# Patient Record
Sex: Male | Born: 1960 | Race: White | Hispanic: No | Smoking: Former smoker
Health system: Southern US, Community
[De-identification: ages and names within clinical notes are randomized; demographics above are authoritative.]

## PROBLEM LIST (undated history)

## (undated) DIAGNOSIS — E785 Hyperlipidemia, unspecified: Secondary | ICD-10-CM

## (undated) HISTORY — PX: NO PAST SURGERIES: SHX2092

## (undated) HISTORY — DX: Hyperlipidemia, unspecified: E78.5

---

## 2008-10-23 ENCOUNTER — Ambulatory Visit: Payer: Self-pay | Admitting: Internal Medicine

## 2008-10-23 DIAGNOSIS — R209 Unspecified disturbances of skin sensation: Secondary | ICD-10-CM | POA: Insufficient documentation

## 2008-10-23 LAB — CONVERTED CEMR LAB
ALT: 32 units/L (ref 0–53)
Basophils Absolute: 0.1 10*3/uL (ref 0.0–0.1)
Bilirubin Urine: NEGATIVE
Bilirubin, Direct: 0.1 mg/dL (ref 0.0–0.3)
CO2: 30 meq/L (ref 19–32)
Calcium: 9.3 mg/dL (ref 8.4–10.5)
Cholesterol: 229 mg/dL (ref 0–200)
Eosinophils Absolute: 0.2 10*3/uL (ref 0.0–0.7)
GFR calc Af Amer: 103 mL/min
GFR calc non Af Amer: 86 mL/min
Hemoglobin: 15.7 g/dL (ref 13.0–17.0)
Ketones, ur: NEGATIVE mg/dL
Leukocytes, UA: NEGATIVE
Lymphocytes Relative: 36.7 % (ref 12.0–46.0)
MCHC: 34.7 g/dL (ref 30.0–36.0)
Neutro Abs: 3.3 10*3/uL (ref 1.4–7.7)
Neutrophils Relative %: 50 % (ref 43.0–77.0)
RDW: 12.5 % (ref 11.5–14.6)
Sodium: 141 meq/L (ref 135–145)
TSH: 0.93 microintl units/mL (ref 0.35–5.50)
Total Bilirubin: 0.9 mg/dL (ref 0.3–1.2)
Total CHOL/HDL Ratio: 5
Triglycerides: 99 mg/dL (ref 0–149)
Urobilinogen, UA: 0.2 (ref 0.0–1.0)
VLDL: 20 mg/dL (ref 0–40)
Vitamin B-12: 228 pg/mL (ref 211–911)

## 2009-06-16 ENCOUNTER — Ambulatory Visit: Payer: Self-pay | Admitting: Internal Medicine

## 2009-06-16 LAB — CONVERTED CEMR LAB
Sed Rate: 8 mm/hr (ref 0–22)
TSH: 1.35 microintl units/mL (ref 0.35–5.50)
Vitamin B-12: 275 pg/mL (ref 211–911)

## 2009-07-15 ENCOUNTER — Ambulatory Visit: Payer: Self-pay | Admitting: Internal Medicine

## 2009-07-15 DIAGNOSIS — E538 Deficiency of other specified B group vitamins: Secondary | ICD-10-CM

## 2009-07-15 DIAGNOSIS — G5600 Carpal tunnel syndrome, unspecified upper limb: Secondary | ICD-10-CM | POA: Insufficient documentation

## 2009-08-06 ENCOUNTER — Ambulatory Visit: Payer: Self-pay | Admitting: Internal Medicine

## 2009-08-06 DIAGNOSIS — Z87891 Personal history of nicotine dependence: Secondary | ICD-10-CM | POA: Insufficient documentation

## 2009-08-06 DIAGNOSIS — L538 Other specified erythematous conditions: Secondary | ICD-10-CM

## 2010-07-22 ENCOUNTER — Ambulatory Visit: Payer: Self-pay | Admitting: Internal Medicine

## 2010-07-22 DIAGNOSIS — M25529 Pain in unspecified elbow: Secondary | ICD-10-CM

## 2010-11-17 NOTE — Assessment & Plan Note (Signed)
Summary: arm pain/cd   Vital Signs:  Patient profile:   50 year old male Height:      66 inches Weight:      173 pounds BMI:     28.02 Temp:     97.2 degrees F oral Pulse rate:   53 / minute Pulse rhythm:   regular BP sitting:   110 / 78  (left arm) Cuff size:   regular  Vitals Entered By: Lanier Prude, CMA(AAMA) (July 22, 2010 10:43 AM) CC: Lt elbow pain Is Patient Diabetic? No Comments pt states he is not taking any meds currently   CC:  Lt elbow pain.  History of Present Illness: C/o L elbow pain x 1-2 months  off and on - possible hit it  Preventive Screening-Counseling & Management  Caffeine-Diet-Exercise     Does Patient Exercise: yes  Current Medications (verified): 1)  Triamcinolone Acetonide 0.1 % Oint (Triamcinolone Acetonide) .... Use 1-2 Times A Day 2)  Ibuprofen 600 Mg  Tabs (Ibuprofen) .Marland Kitchen.. 1 By Mouth Two Times A Day As Needed Pain 3)  Vitamin B-12 500 Mcg Tabs (Cyanocobalamin) .Marland Kitchen.. 1 By Mouth Qd 4)  Vitamin D3 1000 Unit  Tabs (Cholecalciferol) .Marland Kitchen.. 1 By Mouth Daily 5)  Pennsaid 1.5 % Soln (Diclofenac Sodium) .... 3 Gtt On Wrist Tid  Allergies (verified): No Known Drug Allergies  Social History: Occupation: Curator  Married Former Smoker Alcohol use-no Regular exercise-yes Does Patient Exercise:  yes  Review of Systems       Lost wt on diet  Physical Exam  General:  Well-developed,well-nourished,in no acute distress; alert,appropriate and cooperative throughout examination Lungs:  CTA Heart:  RRR Msk:  Lelbow  lat epic is tender Skin:  Erythem confluent papules w/scaling in inguinal folds B   Impression & Recommendations:  Problem # 1:  ELBOW PAIN (ICD-719.42) L - poss tennis elbow Assessment New Pennsaid Ibuprofen if not better Will inject if meds fail Orders: T-Elbow Left 2 View (73070TC)  Problem # 2:  Wt loss on diet Assessment: New He is feeling well  Complete Medication List: 1)  Triamcinolone Acetonide 0.1 % Oint  (Triamcinolone acetonide) .... Use 1-2 times a day 2)  Ibuprofen 600 Mg Tabs (Ibuprofen) .Marland Kitchen.. 1 by mouth two times a day as needed pain 3)  Vitamin B-12 500 Mcg Tabs (Cyanocobalamin) .Marland Kitchen.. 1 by mouth qd 4)  Vitamin D3 1000 Unit Tabs (Cholecalciferol) .Marland Kitchen.. 1 by mouth daily 5)  Pennsaid 1.5 % Soln (Diclofenac sodium) .... 3 gtt  three times a day on elbow 6)  Naproxen 500 Mg Tabs (Naproxen) .Marland Kitchen.. 1 by mouth two times a day pc for pain x 2 wks then prn  Patient Instructions: 1)  Call if you are not better in a reasonable amount of time or if worse.  Prescriptions: PENNSAID 1.5 % SOLN (DICLOFENAC SODIUM) 3 gtt  three times a day on elbow  #1 x 1   Entered and Authorized by:   Tresa Garter MD   Signed by:   Tresa Garter MD on 07/22/2010   Method used:   Print then Give to Patient   RxID:   [id number] NAPROXEN 500 MG TABS (NAPROXEN) 1 by mouth two times a day pc for pain x 2 wks then prn  #60 x 1   Entered and Authorized by:   Tresa Garter MD   Signed by:   Tresa Garter MD on 07/22/2010   Method used:   Print then Give to  Patient   RxID:   [id number]

## 2011-11-04 IMAGING — CR DG ELBOW 2V*L*
2 series · 2 of 2 positions shown · non-contrast
Comparison: None.

CLINICAL DATA: Lateral elbow pain

LEFT ELBOW - 2 VIEW

[view not recorded (1 of 2)]
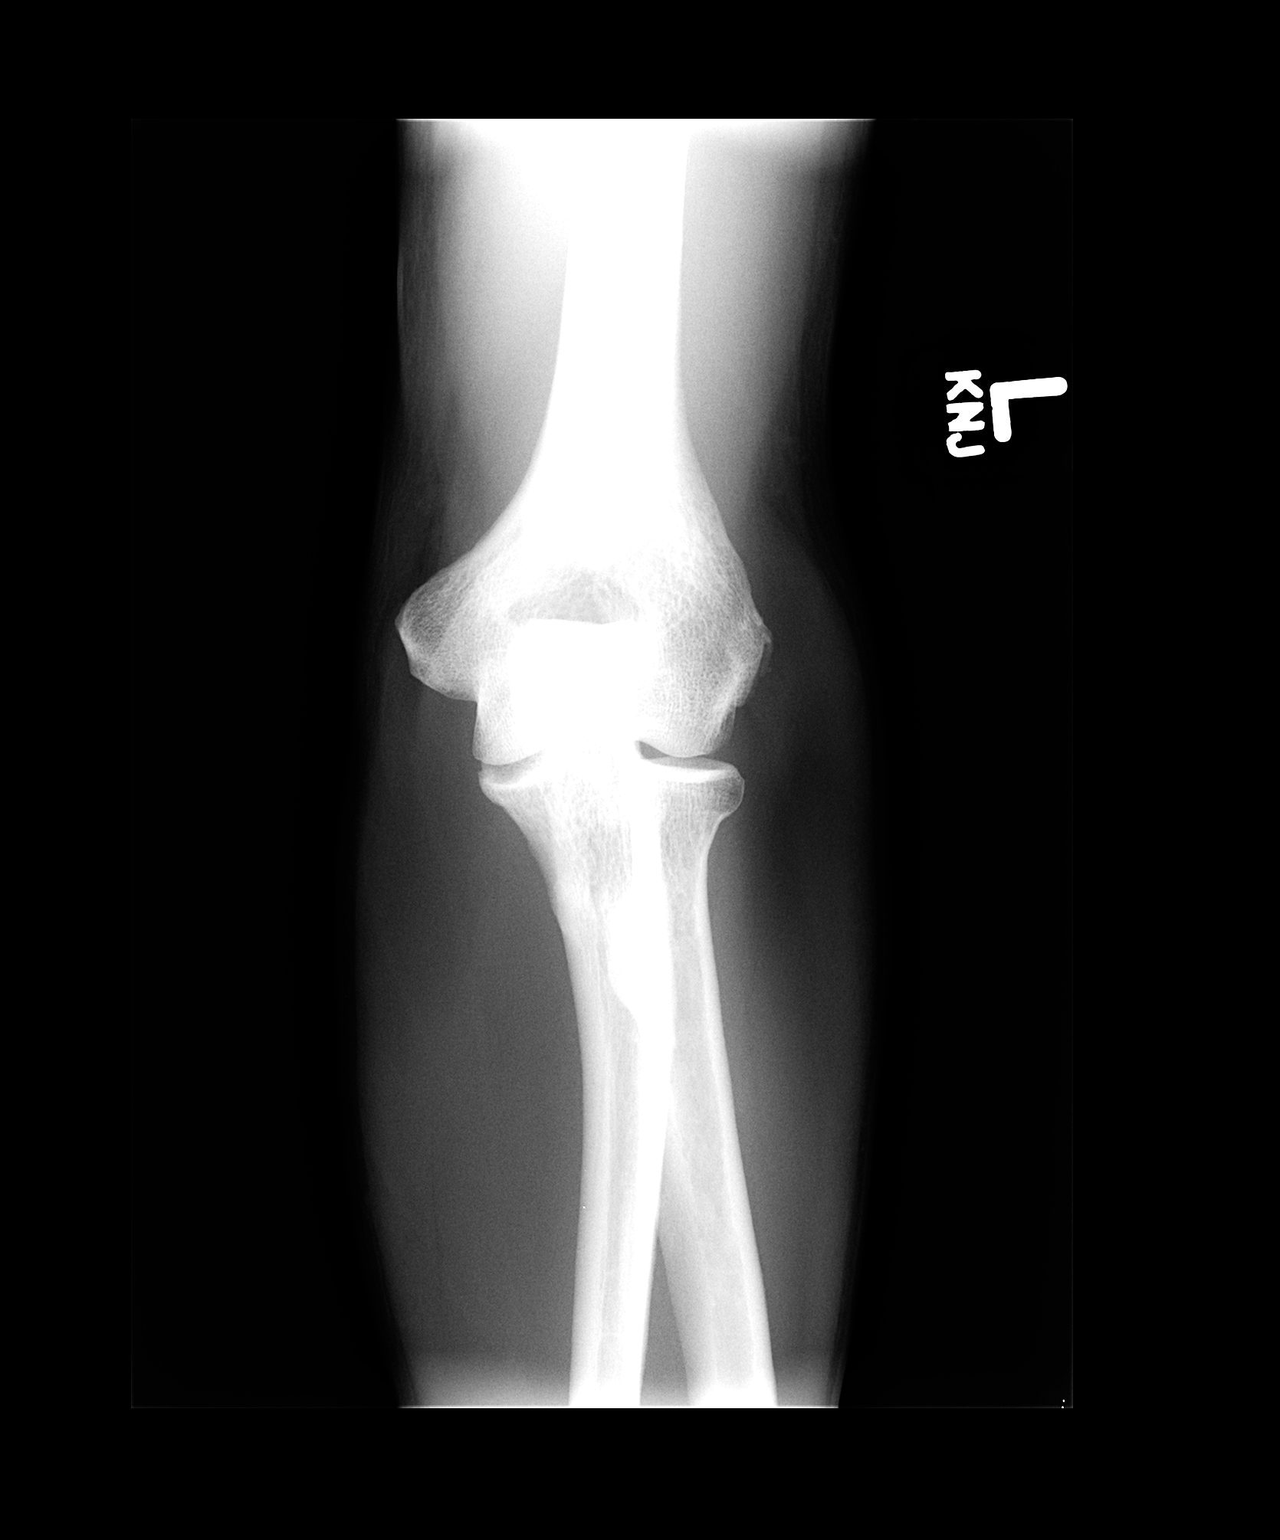

[view not recorded (2 of 2)]
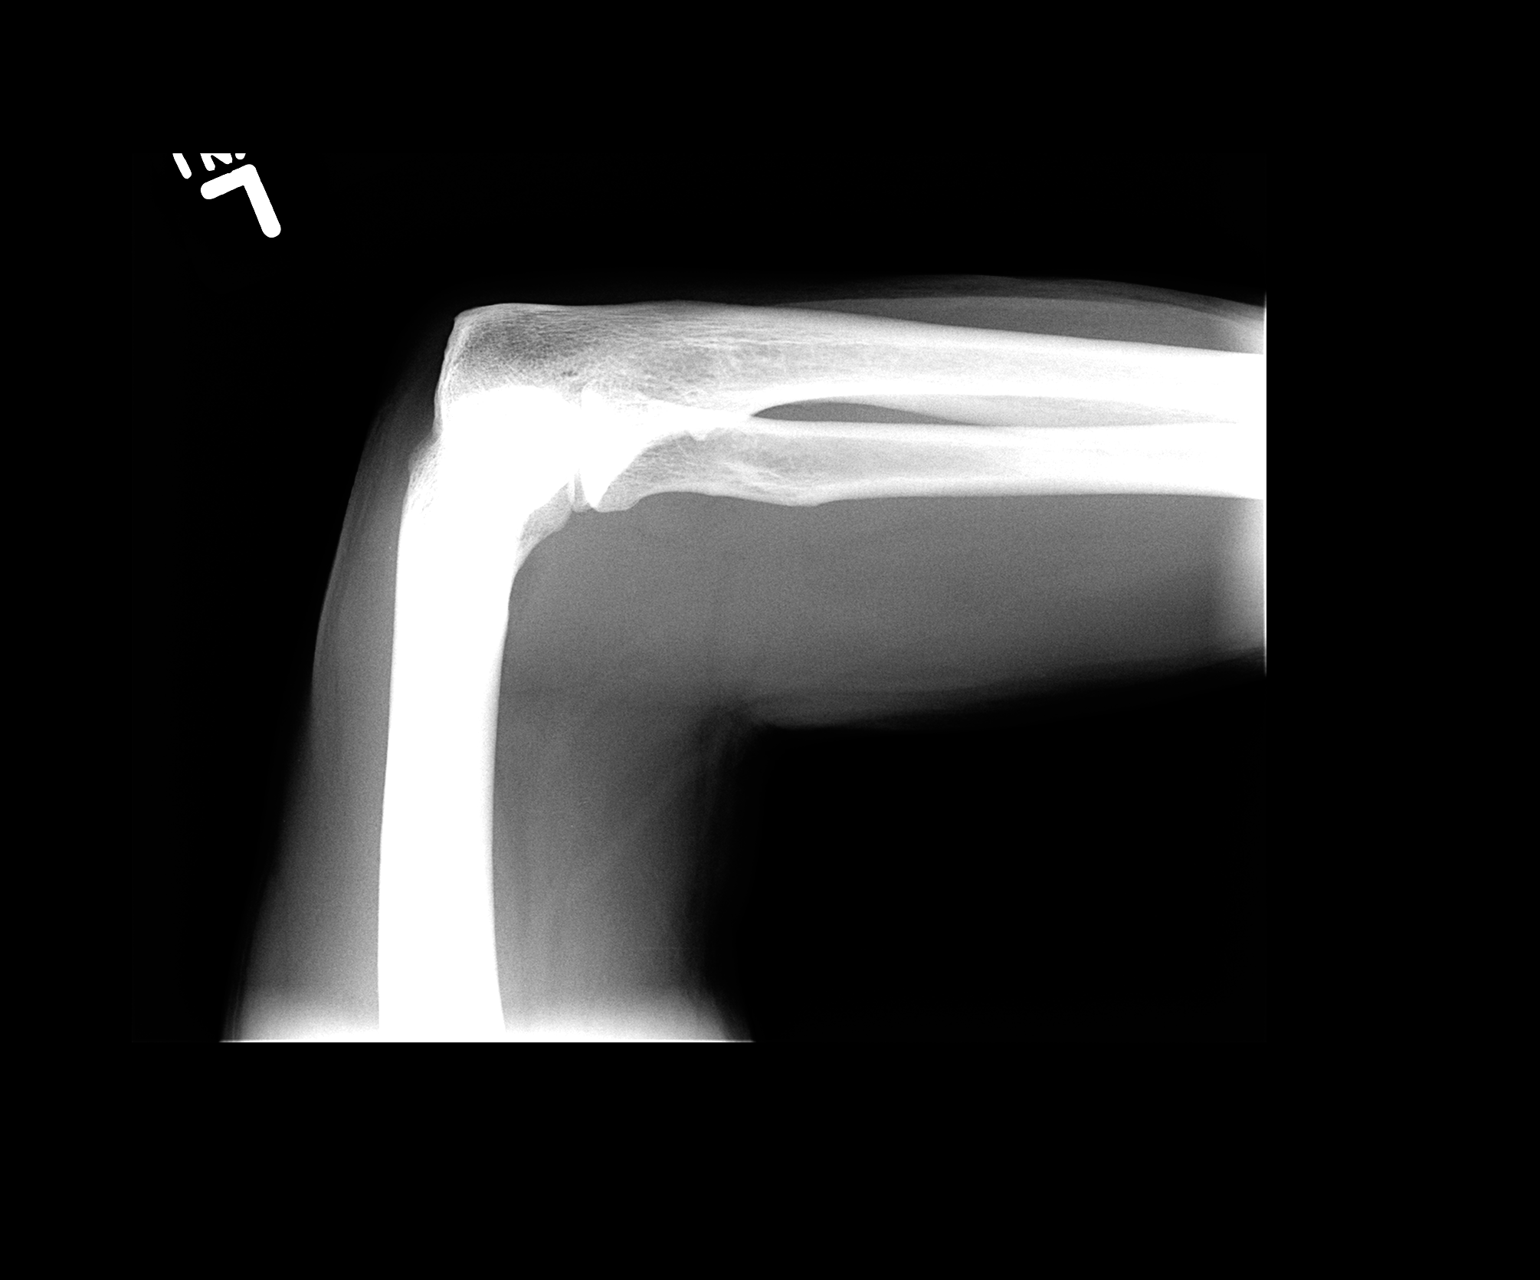

[2 of 2 positions shown; findings below may reference images not displayed]

FINDINGS: There is no evidence of acute fracture dislocation.  A
small osteophytic right is seen at the lateral epicondyle.  No
joint effusion is seen.  The soft tissues are within normal limits.
IMPRESSION: Small osteophyte at the lateral epicondyle.  No acute fracture.

## 2012-03-15 ENCOUNTER — Ambulatory Visit: Payer: Self-pay | Admitting: Family Medicine

## 2012-03-21 ENCOUNTER — Encounter: Payer: Self-pay | Admitting: Internal Medicine

## 2012-03-21 ENCOUNTER — Ambulatory Visit (INDEPENDENT_AMBULATORY_CARE_PROVIDER_SITE_OTHER): Payer: Managed Care, Other (non HMO) | Admitting: Internal Medicine

## 2012-03-21 VITALS — BP 128/80 | HR 60 | Temp 98.1°F | Resp 16 | Ht 69.0 in | Wt 172.0 lb

## 2012-03-21 DIAGNOSIS — Z0000 Encounter for general adult medical examination without abnormal findings: Secondary | ICD-10-CM

## 2012-03-21 LAB — COMPREHENSIVE METABOLIC PANEL
ALT: 22 U/L (ref 0–53)
AST: 30 U/L (ref 0–37)
Alkaline Phosphatase: 43 U/L (ref 39–117)
BUN: 18 mg/dL (ref 6–23)
Creatinine, Ser: 0.9 mg/dL (ref 0.4–1.5)
Total Bilirubin: 0.5 mg/dL (ref 0.3–1.2)

## 2012-03-21 LAB — CBC WITH DIFFERENTIAL/PLATELET
Basophils Absolute: 0.1 10*3/uL (ref 0.0–0.1)
Basophils Relative: 1.4 % (ref 0.0–3.0)
Eosinophils Absolute: 0.3 10*3/uL (ref 0.0–0.7)
HCT: 43.9 % (ref 39.0–52.0)
Hemoglobin: 14.5 g/dL (ref 13.0–17.0)
Lymphs Abs: 1.9 10*3/uL (ref 0.7–4.0)
MCHC: 33 g/dL (ref 30.0–36.0)
Neutro Abs: 3.1 10*3/uL (ref 1.4–7.7)
RBC: 4.99 Mil/uL (ref 4.22–5.81)
RDW: 13.7 % (ref 11.5–14.6)

## 2012-03-21 NOTE — Patient Instructions (Signed)
It is important that you exercise regularly, at least 20 minutes 3 to 4 times per week.  If you develop chest pain or shortness of breath seek  medical attention.  Limit your sodium (Salt) intake  Return in one year for follow-up  

## 2012-03-21 NOTE — Progress Notes (Signed)
Subjective:    Patient ID: Anthony Acosta, male    DOB: 1961-08-21, 51 y.o.   MRN: [id number]  HPI  51 .-year-old patient who is seen today to establish with this practice. He has enjoyed excellent health. He has been followed by another provider is in this practice.  He has a history of borderline hypertension but has been very successful at weight loss and a regular exercise regimen. 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 Approximately 2 or 3 weeks ago he had an episode of brief dizziness while working underneath the chassis of a truck. This has not reoccurred. He has had no symptoms with jogging. He is accompanied by his son who assists  with communication.  Past medical history is fairly unremarkable. He does have a history of B12 deficiency controlled with oral medication. He continues to have symptoms consistent with mild right carpal tunnel syndrome. He is a former smoker but discontinued in March 20, 2007  Family history father died of a CVA at 39. Mother also had cerebral vascular disease and died at 46 also had coronary artery disease and what sounds like a pacemaker insertion One brother health unknown  Social history. Presently nonsmoker married quite active physically. Works as a Curator    Review of Systems  Constitutional: Negative for fever, chills, activity change, appetite change and fatigue.  HENT: Negative for hearing loss, ear pain, congestion, rhinorrhea, sneezing, mouth sores, trouble swallowing, neck pain, neck stiffness, dental problem, voice change, sinus pressure and tinnitus.   Eyes: Negative for photophobia, pain, redness and visual disturbance.  Respiratory: Negative for apnea, cough, choking, chest tightness, shortness of breath and wheezing.   Cardiovascular: Negative for chest pain, palpitations and leg swelling.  Gastrointestinal: Negative for nausea, vomiting, abdominal pain, diarrhea, constipation, blood in  stool, abdominal distention, anal bleeding and rectal pain.  Genitourinary: Negative for dysuria, urgency, frequency, hematuria, flank pain, decreased urine volume, discharge, penile swelling, scrotal swelling, difficulty urinating, genital sores and testicular pain.  Musculoskeletal: Negative for myalgias, back pain, joint swelling, arthralgias and gait problem.  Skin: Negative for color change, rash and wound.  Neurological: Negative for dizziness, tremors, seizures, syncope, facial asymmetry, speech difficulty, weakness, light-headedness, numbness (paresthesias right hand) and headaches.  Hematological: Negative for adenopathy. Does not bruise/bleed easily.  Psychiatric/Behavioral: Negative for suicidal ideas, hallucinations, behavioral problems, confusion, sleep disturbance, self-injury, dysphoric mood, decreased concentration and agitation. The patient is not nervous/anxious.        Objective:   Physical Exam  Constitutional: He appears well-developed and well-nourished.  HENT:  Head: Normocephalic and atraumatic.  Right Ear: External ear normal.  Left Ear: External ear normal.  Nose: Nose normal.  Mouth/Throat: Oropharynx is clear and moist.  Eyes: Conjunctivae and EOM are normal. Pupils are equal, round, and reactive to light. No scleral icterus.  Neck: Normal range of motion. Neck supple. No JVD present. No thyromegaly present.  Cardiovascular: Regular rhythm, normal heart sounds and intact distal pulses.  Exam reveals no gallop and no friction rub.   No murmur heard. Pulmonary/Chest: Effort normal and breath sounds normal. He exhibits no tenderness.  Abdominal: Soft. Bowel sounds are normal. He exhibits no distension and no mass. There is no tenderness.  Musculoskeletal: Normal range of motion. He exhibits no edema and no tenderness.       Negative Tinel's  Lymphadenopathy:    He has no cervical adenopathy.  Neurological: He is alert. He has  normal reflexes. No cranial nerve  deficit. Coordination normal.  Skin: Skin is warm and dry. No rash noted.  Psychiatric: He has a normal mood and affect. His behavior is normal.          Assessment & Plan:   Single episode of near-syncope. We'll clinically observe at the present time. Preventive health examination Will continue active lifestyle including exercise regimen and healthy eating habits.  We'll check some updated lab Continue B12 supplementation  Recheck one year or as needed

## 2012-03-22 ENCOUNTER — Encounter: Payer: Self-pay | Admitting: Internal Medicine

## 2012-03-31 ENCOUNTER — Ambulatory Visit: Payer: Self-pay | Admitting: Internal Medicine

## 2013-07-30 ENCOUNTER — Telehealth: Payer: Self-pay | Admitting: Internal Medicine

## 2013-07-30 NOTE — Telephone Encounter (Signed)
Pt needs to have physical schedule before Friday 08/03/13, he is leaving the country.  Please advise if I schedule him and where?  Thanks a bunch.     Archie Patten

## 2013-07-30 NOTE — Telephone Encounter (Signed)
Put to slots together to make a 30 minute slot somewhere.

## 2013-08-01 ENCOUNTER — Encounter: Payer: Self-pay | Admitting: Internal Medicine

## 2013-08-01 ENCOUNTER — Ambulatory Visit (INDEPENDENT_AMBULATORY_CARE_PROVIDER_SITE_OTHER): Payer: Managed Care, Other (non HMO) | Admitting: Internal Medicine

## 2013-08-01 VITALS — BP 120/70 | HR 60 | Temp 97.8°F | Resp 18 | Ht 68.25 in | Wt 165.0 lb

## 2013-08-01 DIAGNOSIS — Z0000 Encounter for general adult medical examination without abnormal findings: Secondary | ICD-10-CM

## 2013-08-01 LAB — COMPREHENSIVE METABOLIC PANEL
ALT: 26 U/L (ref 0–53)
Albumin: 4.1 g/dL (ref 3.5–5.2)
Alkaline Phosphatase: 38 U/L — ABNORMAL LOW (ref 39–117)
CO2: 27 mEq/L (ref 19–32)
Glucose, Bld: 85 mg/dL (ref 70–99)
Potassium: 5 mEq/L (ref 3.5–5.1)
Sodium: 141 mEq/L (ref 135–145)
Total Bilirubin: 0.7 mg/dL (ref 0.3–1.2)
Total Protein: 6.9 g/dL (ref 6.0–8.3)

## 2013-08-01 LAB — LIPID PANEL
HDL: 77.2 mg/dL (ref >39.00)
Total CHOL/HDL Ratio: 3
Triglycerides: 33 mg/dL (ref 0.0–149.0)

## 2013-08-01 LAB — LDL CHOLESTEROL, DIRECT: Direct LDL: 126.1 mg/dL

## 2013-08-01 LAB — CBC WITH DIFFERENTIAL/PLATELET
Basophils Absolute: 0.1 10*3/uL (ref 0.0–0.1)
Eosinophils Absolute: 0.3 10*3/uL (ref 0.0–0.7)
HCT: 42.3 % (ref 39.0–52.0)
Lymphs Abs: 1.7 10*3/uL (ref 0.7–4.0)
MCHC: 33.5 g/dL (ref 30.0–36.0)
MCV: 86.5 fl (ref 78.0–100.0)
Monocytes Absolute: 0.4 10*3/uL (ref 0.1–1.0)
Neutrophils Relative %: 55.4 % (ref 43.0–77.0)
Platelets: 260 10*3/uL (ref 150.0–400.0)
RDW: 13.4 % (ref 11.5–14.6)
WBC: 5.5 10*3/uL (ref 4.5–10.5)

## 2013-08-01 LAB — PSA: PSA: 0.59 ng/mL (ref 0.10–4.00)

## 2013-08-01 NOTE — Progress Notes (Signed)
Patient ID: Anthony Acosta, male   DOB: 1961/05/12, 52 y.o.   MRN: [id number]  Subjective:    Patient ID: Anthony Acosta, male    DOB: [birth year], 52 y.o.   MRN: [id number]  HPI  52 -year-old patient who is seen today for an annual exam.   He has a history of borderline hypertension but has been very successful at weight loss and a regular exercise regimen. 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   Past medical history is fairly unremarkable. He does have a history of B12 deficiency controlled with oral medication. He has a history of symptoms consistent with mild right carpal tunnel syndrome. He is a former smoker but discontinued in 03-19-07  Family history father died of a CVA at 43. Mother also had cerebral vascular disease and died at 39 also had coronary artery disease and what sounds like a pacemaker insertion One brother health unknown  Social history. Presently nonsmoker married quite active physically. Works as a Curator    Review of Systems  Constitutional: Negative for fever, chills, activity change, appetite change and fatigue.  HENT: Negative for congestion, dental problem, ear pain, hearing loss, mouth sores, rhinorrhea, sinus pressure, sneezing, tinnitus, trouble swallowing and voice change.   Eyes: Negative for photophobia, pain, redness and visual disturbance.  Respiratory: Negative for apnea, cough, choking, chest tightness, shortness of breath and wheezing.   Cardiovascular: Negative for chest pain, palpitations and leg swelling.  Gastrointestinal: Negative for nausea, vomiting, abdominal pain, diarrhea, constipation, blood in stool, abdominal distention, anal bleeding and rectal pain.  Genitourinary: Negative for dysuria, urgency, frequency, hematuria, flank pain, decreased urine volume, discharge, penile swelling, scrotal swelling, difficulty urinating, genital sores and testicular pain.  Musculoskeletal: Negative for  arthralgias, back pain, gait problem, joint swelling, myalgias, neck pain and neck stiffness.  Skin: Negative for color change, rash and wound.  Neurological: Negative for dizziness, tremors, seizures, syncope, facial asymmetry, speech difficulty, weakness, light-headedness, numbness (paresthesias right hand) and headaches.  Hematological: Negative for adenopathy. Does not bruise/bleed easily.  Psychiatric/Behavioral: Negative for suicidal ideas, hallucinations, behavioral problems, confusion, sleep disturbance, self-injury, dysphoric mood, decreased concentration and agitation. The patient is not nervous/anxious.        Objective:   Physical Exam  Constitutional: He appears well-developed and well-nourished.  HENT:  Head: Normocephalic and atraumatic.  Right Ear: External ear normal.  Left Ear: External ear normal.  Nose: Nose normal.  Mouth/Throat: Oropharynx is clear and moist.  Eyes: Conjunctivae and EOM are normal. Pupils are equal, round, and reactive to light. No scleral icterus.  Neck: Normal range of motion. Neck supple. No JVD present. No thyromegaly present.  Cardiovascular: Regular rhythm, normal heart sounds and intact distal pulses.  Exam reveals no gallop and no friction rub.   No murmur heard. Pulmonary/Chest: Effort normal and breath sounds normal. He exhibits no tenderness.  Abdominal: Soft. Bowel sounds are normal. He exhibits no distension and no mass. There is no tenderness.  Genitourinary: Prostate normal and penis normal. Guaiac negative stool.  Uncircumcised  Musculoskeletal: Normal range of motion. He exhibits no edema and no tenderness.  Negative Tinel's  Lymphadenopathy:    He has no cervical adenopathy.  Neurological: He is alert. He has normal reflexes. No cranial nerve deficit. Coordination normal.  Skin: Skin is warm and dry. No rash noted.  Psychiatric: He has a normal mood and affect. His behavior is normal.  Assessment & Plan:     Preventive health examination Will continue active lifestyle including exercise regimen and healthy eating habits.  We'll check some updated lab Continue B12 supplementation  Recheck one year or as needed Screening colonoscopy

## 2013-08-01 NOTE — Patient Instructions (Signed)
It is important that you exercise regularly, at least 20 minutes 3 to 4 times per week.  If you develop chest pain or shortness of breath seek  medical attention.  Return in one year for follow-up   Schedule your colonoscopy to help detect colon cancer. 

## 2014-08-05 ENCOUNTER — Encounter: Payer: Self-pay | Admitting: Internal Medicine

## 2014-08-23 ENCOUNTER — Other Ambulatory Visit (INDEPENDENT_AMBULATORY_CARE_PROVIDER_SITE_OTHER): Payer: BC Managed Care – PPO

## 2014-08-23 DIAGNOSIS — Z0000 Encounter for general adult medical examination without abnormal findings: Secondary | ICD-10-CM

## 2014-08-23 LAB — POCT URINALYSIS DIPSTICK
Bilirubin, UA: NEGATIVE
Blood, UA: NEGATIVE
GLUCOSE UA: NEGATIVE
LEUKOCYTES UA: NEGATIVE
Nitrite, UA: NEGATIVE
SPEC GRAV UA: 1.02
UROBILINOGEN UA: 0.2
pH, UA: 5

## 2014-08-23 LAB — CBC WITH DIFFERENTIAL/PLATELET
BASOS ABS: 0 10*3/uL (ref 0.0–0.1)
Basophils Relative: 0.8 % (ref 0.0–3.0)
Eosinophils Absolute: 0.3 10*3/uL (ref 0.0–0.7)
Eosinophils Relative: 5.1 % — ABNORMAL HIGH (ref 0.0–5.0)
HCT: 44.8 % (ref 39.0–52.0)
Hemoglobin: 14.7 g/dL (ref 13.0–17.0)
LYMPHS PCT: 35.6 % (ref 12.0–46.0)
Lymphs Abs: 2.1 10*3/uL (ref 0.7–4.0)
MCHC: 32.9 g/dL (ref 30.0–36.0)
MCV: 87.7 fl (ref 78.0–100.0)
Monocytes Absolute: 0.5 10*3/uL (ref 0.1–1.0)
Monocytes Relative: 9.3 % (ref 3.0–12.0)
Neutro Abs: 2.9 10*3/uL (ref 1.4–7.7)
Neutrophils Relative %: 49.2 % (ref 43.0–77.0)
PLATELETS: 273 10*3/uL (ref 150.0–400.0)
RBC: 5.1 Mil/uL (ref 4.22–5.81)
RDW: 13.7 % (ref 11.5–15.5)
WBC: 5.8 10*3/uL (ref 4.0–10.5)

## 2014-08-23 LAB — LIPID PANEL
CHOLESTEROL: 262 mg/dL — AB (ref 0–200)
HDL: 80.9 mg/dL (ref >39.00)
LDL CALC: 175 mg/dL — AB (ref 0–99)
NonHDL: 181.1
TRIGLYCERIDES: 32 mg/dL (ref 0.0–149.0)
Total CHOL/HDL Ratio: 3
VLDL: 6.4 mg/dL (ref 0.0–40.0)

## 2014-08-23 LAB — HEPATIC FUNCTION PANEL
ALK PHOS: 38 U/L — AB (ref 39–117)
ALT: 29 U/L (ref 0–53)
AST: 41 U/L — ABNORMAL HIGH (ref 0–37)
Albumin: 3.8 g/dL (ref 3.5–5.2)
BILIRUBIN TOTAL: 1.1 mg/dL (ref 0.2–1.2)
Bilirubin, Direct: 0.1 mg/dL (ref 0.0–0.3)
Total Protein: 7.6 g/dL (ref 6.0–8.3)

## 2014-08-23 LAB — BASIC METABOLIC PANEL
BUN: 14 mg/dL (ref 6–23)
CALCIUM: 9.5 mg/dL (ref 8.4–10.5)
CO2: 29 mEq/L (ref 19–32)
CREATININE: 1 mg/dL (ref 0.4–1.5)
Chloride: 104 mEq/L (ref 96–112)
GFR: 83.27 mL/min (ref >60.00)
GLUCOSE: 83 mg/dL (ref 70–99)
Potassium: 4.4 mEq/L (ref 3.5–5.1)
SODIUM: 138 meq/L (ref 135–145)

## 2014-08-23 LAB — TSH: TSH: 1.1 u[IU]/mL (ref 0.35–4.50)

## 2014-08-23 LAB — PSA: PSA: 0.49 ng/mL (ref 0.10–4.00)

## 2014-08-30 ENCOUNTER — Encounter: Payer: BC Managed Care – PPO | Admitting: Internal Medicine

## 2014-09-04 ENCOUNTER — Encounter: Payer: Self-pay | Admitting: Internal Medicine

## 2014-09-04 ENCOUNTER — Ambulatory Visit (INDEPENDENT_AMBULATORY_CARE_PROVIDER_SITE_OTHER): Payer: BC Managed Care – PPO | Admitting: Internal Medicine

## 2014-09-04 VITALS — BP 120/76 | HR 61 | Temp 98.5°F | Resp 20 | Ht 68.5 in | Wt 165.0 lb

## 2014-09-04 DIAGNOSIS — Z0000 Encounter for general adult medical examination without abnormal findings: Secondary | ICD-10-CM

## 2014-09-04 NOTE — Progress Notes (Signed)
Pre visit review using our clinic review tool, if applicable. No additional management support is needed unless otherwise documented below in the visit note. 

## 2014-09-04 NOTE — Progress Notes (Signed)
Subjective:    Patient ID: Anthony Acosta Mcginn, male    DOB: 02-16-1961, 53 y.o.   MRN: [id number]  HPI  Patient ID: Anthony Acosta Delmar, male   DOB: 02-16-1961, 53 y.o.   MRN: [id number]  Subjective:    Patient ID: Anthony Acosta Kirchhoff, male    DOB: 02-16-1961, 53 y.o.   MRN: [id number]  HPI  [age].-year-old patient who is seen today for an annual exam.    He has a history of borderline hypertension but has been very successful at weight loss and a regular exercise regimen. 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   Past medical history is fairly unremarkable. He does have a history of B12 deficiency controlled with oral medication.  He is a former smoker but discontinued in 2008  Family history father died of a CVA at 7667. Mother also had cerebral vascular disease and died at 6576 also had coronary artery disease and what sounds like a pacemaker insertion One brother health unknown  Social history. Presently nonsmoker married quite active physically. Works as a Curatormechanic    Review of Systems  Constitutional: Negative for fever, chills, activity change, appetite change and fatigue.  HENT: Negative for congestion, dental problem, ear pain, hearing loss, mouth sores, rhinorrhea, sinus pressure, sneezing, tinnitus, trouble swallowing and voice change.   Eyes: Negative for photophobia, pain, redness and visual disturbance.  Respiratory: Negative for apnea, cough, choking, chest tightness, shortness of breath and wheezing.   Cardiovascular: Negative for chest pain, palpitations and leg swelling.  Gastrointestinal: Negative for nausea, vomiting, abdominal pain, diarrhea, constipation, blood in stool, abdominal distention, anal bleeding and rectal pain.  Genitourinary: Negative for dysuria, urgency, frequency, hematuria, flank pain, decreased urine volume, discharge, penile swelling, scrotal swelling, difficulty urinating, genital sores and testicular pain.    Musculoskeletal: Negative for arthralgias, back pain, gait problem, joint swelling, myalgias, neck pain and neck stiffness.  Skin: Negative for color change, rash and wound.  Neurological: Negative for dizziness, tremors, seizures, syncope, facial asymmetry, speech difficulty, weakness, light-headedness, numbness (paresthesias right hand) and headaches.  Hematological: Negative for adenopathy. Does not bruise/bleed easily.  Psychiatric/Behavioral: Negative for suicidal ideas, hallucinations, behavioral problems, confusion, sleep disturbance, self-injury, dysphoric mood, decreased concentration and agitation. The patient is not nervous/anxious.        Objective:   Physical Exam  Constitutional: He appears well-developed and well-nourished.  HENT:  Head: Normocephalic and atraumatic.  Right Ear: External ear normal.  Left Ear: External ear normal.  Nose: Nose normal.  Mouth/Throat: Oropharynx is clear and moist.  Eyes: Conjunctivae and EOM are normal. Pupils are equal, round, and reactive to light. No scleral icterus.  Neck: Normal range of motion. Neck supple. No JVD present. No thyromegaly present.  Cardiovascular: Regular rhythm, normal heart sounds and intact distal pulses.  Exam reveals no gallop and no friction rub.   No murmur heard. Pulmonary/Chest: Effort normal and breath sounds normal. He exhibits no tenderness.  Abdominal: Soft. Bowel sounds are normal. He exhibits no distension and no mass. There is no tenderness.  Genitourinary: Prostate normal and penis normal. Guaiac negative stool.  Uncircumcised  Musculoskeletal: Normal range of motion. He exhibits no edema and no tenderness.  Negative Tinel's  Lymphadenopathy:    He has no cervical adenopathy.  Neurological: He is alert. He has normal reflexes. No cranial nerve deficit. Coordination normal.  Skin: Skin is warm and dry. No rash noted.  Psychiatric: He  has a normal mood and affect. His behavior is normal.           Assessment & Plan:    Preventive health examination Will continue active lifestyle including exercise regimen and healthy eating habits.  We'll check some updated lab Continue B12 supplementation  Recheck one year or as needed Screening colonoscopy again encouraged  Review of Systems As above    Objective:   Physical Exam  As above      Assessment & Plan:  As  sbove

## 2014-09-04 NOTE — Patient Instructions (Addendum)
Limit your sodium (Salt) intake    It is important that you exercise regularly, at least 20 minutes 3 to 4 times per week.  If you develop chest pain or shortness of breath seek  medical attention.  Schedule your colonoscopy to help detect colon cancer.  Health Maintenance A healthy lifestyle and preventative care can promote health and wellness.  Maintain regular health, dental, and eye exams.  Eat a healthy diet. Foods like vegetables, fruits, whole grains, low-fat dairy products, and lean protein foods contain the nutrients you need and are low in calories. Decrease your intake of foods high in solid fats, added sugars, and salt. Get information about a proper diet from your health care provider, if necessary.  Regular physical exercise is one of the most important things you can do for your health. Most adults should get at least 150 minutes of moderate-intensity exercise (any activity that increases your heart rate and causes you to sweat) each week. In addition, most adults need muscle-strengthening exercises on 2 or more days a week.   Maintain a healthy weight. The body mass index (BMI) is a screening tool to identify possible weight problems. It provides an estimate of body fat based on height and weight. Your health care provider can find your BMI and can help you achieve or maintain a healthy weight. For males 20 years and older:  A BMI below 18.5 is considered underweight.  A BMI of 18.5 to 24.9 is normal.  A BMI of 25 to 29.9 is considered overweight.  A BMI of 30 and above is considered obese.  Maintain normal blood lipids and cholesterol by exercising and minimizing your intake of saturated fat. Eat a balanced diet with plenty of fruits and vegetables. Blood tests for lipids and cholesterol should begin at age 52 and be repeated every 5 years. If your lipid or cholesterol levels are high, you are over age 52, or you are at high risk for heart disease, you may need your  cholesterol levels checked more frequently.Ongoing high lipid and cholesterol levels should be treated with medicines if diet and exercise are not working.  If you smoke, find out from your health care provider how to quit. If you do not use tobacco, do not start.  Lung cancer screening is recommended for adults aged 55-80 years who are at high risk for developing lung cancer because of a history of smoking. A yearly low-dose CT scan of the lungs is recommended for people who have at least a 30-pack-year history of smoking and are current smokers or have quit within the past 15 years. A pack year of smoking is smoking an average of 1 pack of cigarettes a day for 1 year (for example, a 30-pack-year history of smoking could mean smoking 1 pack a day for 30 years or 2 packs a day for 15 years). Yearly screening should continue until the smoker has stopped smoking for at least 15 years. Yearly screening should be stopped for people who develop a health problem that would prevent them from having lung cancer treatment.  If you choose to drink alcohol, do not have more than 2 drinks per day. One drink is considered to be 12 oz (360 mL) of beer, 5 oz (150 mL) of wine, or 1.5 oz (45 mL) of liquor.  Avoid the use of street drugs. Do not share needles with anyone. Ask for help if you need support or instructions about stopping the use of drugs.  High blood pressure  causes heart disease and increases the risk of stroke. Blood pressure should be checked at least every 1-2 years. Ongoing high blood pressure should be treated with medicines if weight loss and exercise are not effective.  If you are 65-42 years old, ask your health care provider if you should take aspirin to prevent heart disease.  Diabetes screening involves taking a blood sample to check your fasting blood sugar level. This should be done once every 3 years after age 20 if you are at a normal weight and without risk factors for diabetes. Testing  should be considered at a younger age or be carried out more frequently if you are overweight and have at least 1 risk factor for diabetes.  Colorectal cancer can be detected and often prevented. Most routine colorectal cancer screening begins at the age of 3 and continues through age 96. However, your health care provider may recommend screening at an earlier age if you have risk factors for colon cancer. On a yearly basis, your health care provider may provide home test kits to check for hidden blood in the stool. A small camera at the end of a tube may be used to directly examine the colon (sigmoidoscopy or colonoscopy) to detect the earliest forms of colorectal cancer. Talk to your health care provider about this at age 59 when routine screening begins. A direct exam of the colon should be repeated every 5-10 years through age 104, unless early forms of precancerous polyps or small growths are found.  People who are at an increased risk for hepatitis B should be screened for this virus. You are considered at high risk for hepatitis B if:  You were born in a country where hepatitis B occurs often. Talk with your health care provider about which countries are considered high risk.  Your parents were born in a high-risk country and you have not received a shot to protect against hepatitis B (hepatitis B vaccine).  You have HIV or AIDS.  You use needles to inject street drugs.  You live with, or have sex with, someone who has hepatitis B.  You are a man who has sex with other men (MSM).  You get hemodialysis treatment.  You take certain medicines for conditions like cancer, organ transplantation, and autoimmune conditions.  Hepatitis C blood testing is recommended for all people born from [birth year] through 1964 and any individual with known risk factors for hepatitis C.  Healthy men should no longer receive prostate-specific antigen (PSA) blood tests as part of routine cancer screening. Talk to  your health care provider about prostate cancer screening.  Testicular cancer screening is not recommended for adolescents or adult males who have no symptoms. Screening includes self-exam, a health care provider exam, and other screening tests. Consult with your health care provider about any symptoms you have or any concerns you have about testicular cancer.  Practice safe sex. Use condoms and avoid high-risk sexual practices to reduce the spread of sexually transmitted infections (STIs).  You should be screened for STIs, including gonorrhea and chlamydia if:  You are sexually active and are younger than 24 years.  You are older than 24 years, and your health care provider tells you that you are at risk for this type of infection.  Your sexual activity has changed since you were last screened, and you are at an increased risk for chlamydia or gonorrhea. Ask your health care provider if you are at risk.  If you are at risk of  being infected with HIV, it is recommended that you take a prescription medicine daily to prevent HIV infection. This is called pre-exposure prophylaxis (PrEP). You are considered at risk if:  You are a man who has sex with other men (MSM).  You are a heterosexual man who is sexually active with multiple partners.  You take drugs by injection.  You are sexually active with a partner who has HIV.  Talk with your health care provider about whether you are at high risk of being infected with HIV. If you choose to begin PrEP, you should first be tested for HIV. You should then be tested every 3 months for as long as you are taking PrEP.  Use sunscreen. Apply sunscreen liberally and repeatedly throughout the day. You should seek shade when your shadow is shorter than you. Protect yourself by wearing long sleeves, pants, a wide-brimmed hat, and sunglasses year round whenever you are outdoors.  Tell your health care provider of new moles or changes in moles, especially if  there is a change in shape or color. Also, tell your health care provider if a mole is larger than the size of a pencil eraser.  A one-time screening for abdominal aortic aneurysm (AAA) and surgical repair of large AAAs by ultrasound is recommended for men aged 60-75 years who are current or former smokers.  Stay current with your vaccines (immunizations). Document Released: 04/01/2008 Document Revised: 10/09/2013 Document Reviewed: 03/01/2011 [facility] Patient Information 2015 [place], [place]. This information is not intended to replace advice given to you by your health care provider. Make sure you discuss any questions you have with your health care provider.

## 2015-04-23 ENCOUNTER — Ambulatory Visit: Payer: Self-pay

## 2015-04-23 ENCOUNTER — Ambulatory Visit (INDEPENDENT_AMBULATORY_CARE_PROVIDER_SITE_OTHER): Payer: BLUE CROSS/BLUE SHIELD | Admitting: Family Medicine

## 2015-04-23 VITALS — BP 126/80 | HR 69 | Temp 98.3°F | Resp 17 | Ht 68.5 in | Wt 163.0 lb

## 2015-04-23 DIAGNOSIS — L0291 Cutaneous abscess, unspecified: Secondary | ICD-10-CM

## 2015-04-23 DIAGNOSIS — L089 Local infection of the skin and subcutaneous tissue, unspecified: Secondary | ICD-10-CM

## 2015-04-23 DIAGNOSIS — L0390 Cellulitis, unspecified: Secondary | ICD-10-CM | POA: Diagnosis not present

## 2015-04-23 DIAGNOSIS — Z23 Encounter for immunization: Secondary | ICD-10-CM | POA: Diagnosis not present

## 2015-04-23 MED ORDER — CEPHALEXIN 500 MG PO CAPS: 500.0000 mg | ORAL_CAPSULE | Freq: Four times a day (QID) | ORAL | Status: DC

## 2015-04-23 MED ORDER — CEFTRIAXONE SODIUM 1 G IJ SOLR
1.0000 g | Freq: Once | INTRAMUSCULAR | Status: AC
  Administered 2015-04-23: 1 g via INTRAMUSCULAR

## 2015-04-23 NOTE — Progress Notes (Signed)
Procedure: Verbal consent obtained. Skin was cleaned with alcohol swab and anesthetized with 1 cc 1% lido without epi. A 0.75 cm incision was made at base of left middle finger. A moderate amount of purulent material expressed. Small wound cavity size. Wound packed with small amount 1/4 inch packing. Wound dressed and wound care discussed.

## 2015-04-23 NOTE — Patient Instructions (Signed)
   Change dressing if dressing is soiled. Keep covered at all times except when showering. You may get wet in the shower but do not scrub.  Apply warm compresses/heating pad to facilitate drainage. Leave packing in place. Do not apply any ointments as this may delay healing. If an antibiotic was prescribed, take as directed until finished. Return in 48 hours for wound care.      

## 2015-04-23 NOTE — Progress Notes (Signed)
Chief Complaint:  Chief Complaint  Patient presents with  . Finger Injury    HPI: Anthony Acosta is a 54 y.o. male who is here for  a partial or area with erythema of his left middle finger which started yesterday. He had a little nodule there that have been present for a long time and he started poking and because it started hurting. Then it became worse. Now he has redness and warmth that is spreading from that area to the rest of his finger and up his hand. 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 He has had no fevers or chills. He has not tried anything for this except to clean it out with soap and water. He is right-hand dominant. This is on his left hand. He thinks he might be up-to-date on his tetanus is but not sure  No past medical history on file. No past surgical history on file. History   Social History  . Marital Status: Married    Spouse Name: N/A  . Number of Children: N/A  . Years of Education: N/A   Social History Main Topics  . Smoking status: Former Games developermoker  . Smokeless tobacco: Never Used  . Alcohol Use: No  . Drug Use: No  . Sexual Activity: Not on file   Other Topics Concern  . None   Social History Narrative   Family History  Problem Relation Age of Onset  . Diabetes Mother    No Known Allergies Prior to Admission medications   Not on File     ROS: The patient denies fevers, chills, night sweats, unintentional weight loss, chest pain, palpitations, wheezing, dyspnea on exertion, nausea, vomiting, abdominal pain, dysuria, hematuria, melena, numbness, weakness, or tingling.   All other systems have been reviewed and were otherwise negative with the exception of those mentioned in the HPI and as above.    PHYSICAL EXAM: Filed Vitals:   04/23/15 0912  BP: 126/80  Pulse: 69  Temp: 98.3 F (36.8 C)  Resp: 17   Filed Vitals:   04/23/15 0912  Height: 5' 8.5" (1.74 m)  Weight: 163 lb (73.936 kg)   Body mass index  is 24.42 kg/(m^2).   General: Alert, no acute distress HEENT:  Normocephalic, atraumatic, oropharynx patent. EOMI, PERRLA Cardiovascular:  Regular rate and rhythm, no rubs murmurs or gallops.  No Carotid bruits, radial pulse intact. No pedal edema.  Respiratory: Clear to auscultation bilaterally.  No wheezes, rales, or rhonchi.  No cyanosis, no use of accessory musculature GI: No organomegaly, abdomen is soft and non-tender, positive bowel sounds.  No masses. Skin: Positive left middle finger cellulitis with small abscess, neurovascularly intact in the hand. Good radial pulses. Full range of motion 5 out of 5 strength sensation intact. Neurologic: Facial musculature symmetric. Psychiatric: Patient is appropriate throughout our interaction. Lymphatic: No cervical lymphadenopathy Musculoskeletal: Gait intact.   LABS: Results for orders placed or performed in visit on 08/23/14  Basic metabolic panel  Result Value Ref Range   Sodium 138 135 - 145 mEq/L   Potassium 4.4 3.5 - 5.1 mEq/L   Chloride 104 96 - 112 mEq/L   CO2 29 19 - 32 mEq/L   Glucose, Bld 83 70 - 99 mg/dL   BUN 14 6 - 23 mg/dL   Creatinine, Ser 1.0 0.4 - 1.5 mg/dL   Calcium 9.5 8.4 - 62.910.5 mg/dL   GFR 52.8483.27 >13.24>60.00 mL/min  CBC with Differential  Result Value  Ref Range   WBC 5.8 4.0 - 10.5 K/uL   RBC 5.10 4.22 - 5.81 Mil/uL   Hemoglobin 14.7 13.0 - 17.0 g/dL   HCT 16.1 09.6 - 04.5 %   MCV 87.7 78.0 - 100.0 fl   MCHC 32.9 30.0 - 36.0 g/dL   RDW 40.9 81.1 - 91.4 %   Platelets 273.0 150.0 - 400.0 K/uL   Neutrophils Relative % 49.2 43.0 - 77.0 %   Lymphocytes Relative 35.6 12.0 - 46.0 %   Monocytes Relative 9.3 3.0 - 12.0 %   Eosinophils Relative 5.1 (H) 0.0 - 5.0 %   Basophils Relative 0.8 0.0 - 3.0 %   Neutro Abs 2.9 1.4 - 7.7 K/uL   Lymphs Abs 2.1 0.7 - 4.0 K/uL   Monocytes Absolute 0.5 0.1 - 1.0 K/uL   Eosinophils Absolute 0.3 0.0 - 0.7 K/uL   Basophils Absolute 0.0 0.0 - 0.1 K/uL  Hepatic function panel  Result  Value Ref Range   Total Bilirubin 1.1 0.2 - 1.2 mg/dL   Bilirubin, Direct 0.1 0.0 - 0.3 mg/dL   Alkaline Phosphatase 38 (L) 39 - 117 U/L   AST 41 (H) 0 - 37 U/L   ALT 29 0 - 53 U/L   Total Protein 7.6 6.0 - 8.3 g/dL   Albumin 3.8 3.5 - 5.2 g/dL  TSH  Result Value Ref Range   TSH 1.10 0.35 - 4.50 uIU/mL  PSA  Result Value Ref Range   PSA 0.49 0.10 - 4.00 ng/mL  Lipid panel  Result Value Ref Range   Cholesterol 262 (H) 0 - 200 mg/dL   Triglycerides 78.2 0.0 - 149.0 mg/dL   HDL 95.62 >13.08 mg/dL   VLDL 6.4 0.0 - 65.7 mg/dL   LDL Cholesterol 846 (H) 0 - 99 mg/dL   Total CHOL/HDL Ratio 3    NonHDL 181.10   POCT urinalysis dipstick  Result Value Ref Range   Color, UA yellow    Clarity, UA clear    Glucose, UA n    Bilirubin, UA n    Ketones, UA trace    Spec Grav, UA 1.020    Blood, UA n    pH, UA 5.0    Protein, UA trace    Urobilinogen, UA 0.2    Nitrite, UA n    Leukocytes, UA Negative      EKG/XRAY:   Primary read interpreted by Dr. Conley Rolls at [facility].   ASSESSMENT/PLAN: Encounter Diagnoses  Name Primary?  . Cellulitis and abscess Yes  . Finger infection   . Need for prophylactic vaccination with combined diphtheria-tetanus-pertussis (DTP) vaccine    Tetanus vaccine was given Keflex PO  4 times a day Fu in  2 days Wound care given, skin markings,  precautions given   Gross sideeffects, risk and benefits, and alternatives of medications d/w patient. Patient is aware that all medications have potential sideeffects and we are unable to predict every sideeffect or drug-drug interaction that may occur.  Kelten Enochs PHUONG, DO 04/23/2015 10:29 AM

## 2015-04-25 ENCOUNTER — Ambulatory Visit (INDEPENDENT_AMBULATORY_CARE_PROVIDER_SITE_OTHER): Payer: BLUE CROSS/BLUE SHIELD | Admitting: Emergency Medicine

## 2015-04-25 VITALS — BP 114/74 | HR 58 | Temp 98.5°F | Resp 16 | Ht 68.5 in | Wt 167.6 lb

## 2015-04-25 DIAGNOSIS — L0291 Cutaneous abscess, unspecified: Secondary | ICD-10-CM

## 2015-04-25 DIAGNOSIS — L0390 Cellulitis, unspecified: Secondary | ICD-10-CM

## 2015-04-25 LAB — WOUND CULTURE: GRAM STAIN: NONE SEEN

## 2015-04-25 MED ORDER — SULFAMETHOXAZOLE-TRIMETHOPRIM 800-160 MG PO TABS: 1.0000 | ORAL_TABLET | Freq: Two times a day (BID) | ORAL | Status: DC

## 2015-04-25 NOTE — Patient Instructions (Signed)

## 2015-04-25 NOTE — Progress Notes (Signed)
Subjective:  Patient ID: Anthony Acosta, male    DOB: 1961/07/18  Age: 54 y.o. MRN: [id number]  CC: Wound Check and Medication question   HPI Anthony Acosta presents  with a 2 day history of abscess on the base of his finger. Incised 2 days ago. He has been put on Keflex and has had some improvement he can make a fist and has no further drainage. Cellulitis is improved. He has no fever chills. He has no foreign body history. 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   He has no past surgical history on file.   His  family history includes Diabetes in his mother.  He   reports that he has quit smoking. He has never used smokeless tobacco. He reports that he does not drink alcohol or use illicit drugs.  Outpatient Prescriptions Prior to Visit  Medication Sig Dispense Refill  . cephALEXin (KEFLEX) 500 MG capsule Take 1 capsule (500 mg total) by mouth 4 (four) times daily. Take with food 40 capsule 0   No facility-administered medications prior to visit.    History   Social History  . Marital Status: Married    Spouse Name: N/A  . Number of Children: N/A  . Years of Education: N/A   Social History Main Topics  . Smoking status: Former Games developermoker  . Smokeless tobacco: Never Used  . Alcohol Use: No  . Drug Use: No  . Sexual Activity: Not on file   Other Topics Concern  . None   Social History Narrative     Review of Systems  Constitutional: Negative for fever, chills and appetite change.  HENT: Negative for congestion, ear pain, postnasal drip, sinus pressure and sore throat.   Eyes: Negative for pain and redness.  Respiratory: Negative for cough, shortness of breath and wheezing.   Cardiovascular: Negative for leg swelling.  Gastrointestinal: Negative for nausea, vomiting, abdominal pain, diarrhea, constipation and blood in stool.  Endocrine: Negative for polyuria.  Genitourinary: Negative for dysuria, urgency,  frequency and flank pain.  Musculoskeletal: Negative for gait problem.  Skin: Negative for rash.  Neurological: Negative for weakness and headaches.  Psychiatric/Behavioral: Negative for confusion and decreased concentration. The patient is not nervous/anxious.     Objective:  BP 114/74 mmHg  Pulse 58  Temp(Src) 98.5 F (36.9 C) (Oral)  Resp 16  Ht 5' 8.5" (1.74 m)  Wt 167 lb 9.6 oz (76.023 kg)  BMI 25.11 kg/m2  SpO2 99%  Physical Exam  Constitutional: He is oriented to person, place, and time. He appears well-developed and well-nourished.  HENT:  Head: Normocephalic and atraumatic.  Eyes: Conjunctivae are normal. Pupils are equal, round, and reactive to light.  Pulmonary/Chest: Effort normal.  Musculoskeletal: He exhibits no edema.  Neurological: He is alert and oriented to person, place, and time.  Skin: Skin is dry.  Psychiatric: He has a normal mood and affect. His behavior is normal. Thought content normal.   he has moderate improvement in the extent of his cellulitis and swelling. He is able to make a fist.    Assessment & Plan:   Anthony Acosta was seen today for wound check and medication question.  Diagnoses and all orders for this visit:  Cellulitis and abscess  Other orders -     sulfamethoxazole-trimethoprim (BACTRIM DS,SEPTRA DS) 800-160 MG per tablet; Take 1 tablet by mouth 2 (two) times daily.   I am having Mr. Bergeson start  on sulfamethoxazole-trimethoprim. I am also having him maintain his cephALEXin.  Meds ordered this encounter  Medications  . sulfamethoxazole-trimethoprim (BACTRIM DS,SEPTRA DS) 800-160 MG per tablet    Sig: Take 1 tablet by mouth 2 (two) times daily.    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  Patient expressed his understanding.  Follow-up: Return if  symptoms worsen or fail to improve.  Carmelina Dane, MD

## 2016-08-20 ENCOUNTER — Encounter: Payer: Self-pay | Admitting: Internal Medicine

## 2016-10-19 ENCOUNTER — Ambulatory Visit (AMBULATORY_SURGERY_CENTER): Payer: Self-pay

## 2016-10-19 VITALS — Ht 69.0 in | Wt 171.6 lb

## 2016-10-19 DIAGNOSIS — Z1211 Encounter for screening for malignant neoplasm of colon: Secondary | ICD-10-CM

## 2016-10-19 MED ORDER — NA SULFATE-K SULFATE-MG SULF 17.5-3.13-1.6 GM/177ML PO SOLN: ORAL | 0 refills | Status: DC

## 2016-10-19 NOTE — Progress Notes (Signed)
Per pt, no allergies to soy or egg products.Pt not taking any weight loss meds or using  O2 at home. 

## 2016-11-01 ENCOUNTER — Encounter: Payer: Self-pay | Admitting: Internal Medicine

## 2016-11-01 ENCOUNTER — Ambulatory Visit (AMBULATORY_SURGERY_CENTER): Payer: BLUE CROSS/BLUE SHIELD | Admitting: Internal Medicine

## 2016-11-01 VITALS — BP 140/56 | HR 57 | Temp 98.4°F | Resp 18 | Ht 69.0 in | Wt 171.0 lb

## 2016-11-01 DIAGNOSIS — Z1211 Encounter for screening for malignant neoplasm of colon: Secondary | ICD-10-CM

## 2016-11-01 DIAGNOSIS — Z1212 Encounter for screening for malignant neoplasm of rectum: Secondary | ICD-10-CM | POA: Diagnosis not present

## 2016-11-01 MED ORDER — SODIUM CHLORIDE 0.9 % IV SOLN: 500.0000 mL | INTRAVENOUS | Status: DC

## 2016-11-01 NOTE — Patient Instructions (Signed)
YOU HAD AN ENDOSCOPIC PROCEDURE TODAY AT THE [facility]:   Refer to the procedure report that was given to you for any specific questions about what was found during the examination.  If the procedure report does not answer your questions, please call your gastroenterologist to clarify.  If you requested that your care partner not be given the details of your procedure findings, then the procedure report has been included in a sealed envelope for you to review at your convenience later.  YOU SHOULD EXPECT: Some feelings of bloating in the abdomen. Passage of more gas than usual.  Walking can help get rid of the air that was put into your GI tract during the procedure and reduce the bloating. If you had a lower endoscopy (such as a colonoscopy or flexible sigmoidoscopy) you may notice spotting of blood in your stool or on the toilet paper. If you underwent a bowel prep for your procedure, you may not have a normal bowel movement for a few days.  Please Note:  You might notice some irritation and congestion in your nose or some drainage.  This is from the oxygen used during your procedure.  There is no need for concern and it should clear up in a day or so.  SYMPTOMS TO REPORT IMMEDIATELY:   Following lower endoscopy (colonoscopy or flexible sigmoidoscopy):  Excessive amounts of blood in the stool  Significant tenderness or worsening of abdominal pains  Swelling of the abdomen that is new, acute  Fever of 100F or higher   Following upper endoscopy (EGD)  Vomiting of blood or coffee ground material  New chest pain or pain under the shoulder blades  Painful or persistently difficult swallowing  New shortness of breath  Fever of 100F or higher  Black, tarry-looking stools  For urgent or emergent issues, a gastroenterologist can be reached at any hour by calling [phone].   DIET:  We do recommend a small meal at first, but then you may proceed to your regular diet.  Drink  plenty of fluids but you should avoid alcoholic beverages for 24 hours.  ACTIVITY:  You should plan to take it easy for the rest of today and you should NOT DRIVE or use heavy machinery until tomorrow (because of the sedation medicines used during the test).    FOLLOW UP: Our staff will call the number listed on your records the next business day following your procedure to check on you and address any questions or concerns that you may have regarding the information given to you following your procedure. If we do not reach you, we will leave a message.  However, if you are feeling well and you are not experiencing any problems, there is no need to return our call.  We will assume that you have returned to your regular daily activities without incident.  If any biopsies were taken you will be contacted by phone or by letter within the next 1-3 weeks.  Please call us at [phone] if you have not heard about the biopsies in 3 weeks.    SIGNATURES/CONFIDENTIALITY: You and/or your care partner have signed paperwork which will be entered into your electronic medical record.  These signatures attest to the fact that that the information above on your After Visit Summary has been reviewed and is understood.  Full responsibility of the confidentiality of this discharge information lies with you and/or your care-partner.  Diverticulosis, and hemorrhoid information given. Colonoscopy 10 years.

## 2016-11-01 NOTE — Progress Notes (Signed)
To nrecovery, report to RN, VSS

## 2016-11-01 NOTE — Op Note (Signed)
[facility] Patient Name: Anthony SalvageMitko Engdahl Procedure Date: 11/01/2016 11:40 AM MRN: [id number] Endoscopist: Wilhemina BonitoJohn N. Marina GoodellPerry , MD Age: [age] Referring MD:  Date of Birth: March 31, 1961 Gender: Male Account #: [id number] Procedure:                Colonoscopy Indications:              Screening for colorectal malignant neoplasm Medicines:                Monitored Anesthesia Care Procedure:                Pre-Anesthesia Assessment:                           - Prior to the procedure, a History and Physical                            was performed, and patient medications and                            allergies were reviewed. The patient's tolerance of                            previous anesthesia was also reviewed. The risks                            and benefits of the procedure and the sedation                            options and risks were discussed with the patient.                            All questions were answered, and informed consent                            was obtained. Prior Anticoagulants: The patient has                            taken no previous anticoagulant or antiplatelet                            agents. ASA Grade Assessment: I - A normal, healthy                            patient. After reviewing the risks and benefits,                            the patient was deemed in satisfactory condition to                            undergo the procedure.                           After obtaining informed consent, the colonoscope  was passed under direct vision. Throughout the                            procedure, the patient's blood pressure, pulse, and                            oxygen saturations were monitored continuously. The                            Colonoscope was introduced through the anus and                            advanced to the the cecum, identified by                            appendiceal orifice and ileocecal valve.  The                            ileocecal valve, appendiceal orifice, and rectum                            were photographed. The quality of the bowel                            preparation was excellent. The colonoscopy was                            performed without difficulty. The patient tolerated                            the procedure well. The bowel preparation used was                            Suprep. Scope In: 11:53:07 AM Scope Out: 12:03:09 PM Scope Withdrawal Time: 0 hours 7 minutes 23 seconds  Total Procedure Duration: 0 hours 10 minutes 2 seconds  Findings:                 A few diverticula were found in the ascending colon.                           Internal hemorrhoids were found during                            retroflexion. The hemorrhoids were small.                           The exam was otherwise without abnormality on                            direct and retroflexion views. Complications:            No immediate complications. Estimated blood loss:                            None. Estimated  Blood Loss:     Estimated blood loss: none. Impression:               - Diverticulosis in the ascending colon.                           - Internal hemorrhoids.                           - The examination was otherwise normal on direct                            and retroflexion views.                           - No specimens collected. Recommendation:           - Repeat colonoscopy in 10 years for screening                            purposes.                           - Patient has a contact number available for                            emergencies. The signs and symptoms of potential                            delayed complications were discussed with the                            patient. Return to normal activities tomorrow.                            Written discharge instructions were provided to the                            patient.                            - Resume previous diet.                           - Continue present medications. Wilhemina Bonito. Marina Goodell, MD 11/01/2016 12:07:37 PM This report has been signed electronically.

## 2016-11-02 ENCOUNTER — Telehealth: Payer: Self-pay

## 2016-11-02 NOTE — Telephone Encounter (Signed)
  Follow up Call-  Call back number 11/01/2016  Post procedure Call Back phone  # [phone]  Wife Valentina GuLucy  Permission to leave phone message Yes  Some recent data might be hidden     Patient questions:  Do you have a fever, pain , or abdominal swelling? No. Pain Score  0 *  Have you tolerated food without any problems? Yes.    Have you been able to return to your normal activities? Yes.    Do you have any questions about your discharge instructions: Diet   No. Medications  No. Follow up visit  No.  Do you have questions or concerns about your Care? No.  Actions: * If pain score is 4 or above: No action needed, pain <4.

## 2016-11-02 NOTE — Telephone Encounter (Signed)
  Follow up Call-  Call back number 11/01/2016  Post procedure Call Back phone  # [phone]  Wife Valentina GuLucy  Permission to leave phone message Yes  Some recent data might be hidden    Patient was called for follow up after his procedure on 11/01/2016. No answer at the number given for follow up phone call. A message was left on the answering machine.

## 2020-03-06 ENCOUNTER — Ambulatory Visit: Payer: Self-pay | Attending: Family

## 2020-03-06 DIAGNOSIS — Z23 Encounter for immunization: Secondary | ICD-10-CM

## 2020-03-06 NOTE — Progress Notes (Signed)
   Covid-19 Vaccination Clinic  Name:  Anthony Acosta    MRN: [id number] DOB: 05/23/1961  03/06/2020  [name] was observed post Covid-19 immunization for 15 minutes without incident. He was provided with Vaccine Information Sheet and instruction to access the V-Safe system.   [name] was instructed to call 911 with any severe reactions post vaccine: Marland Kitchen Difficulty breathing  . Swelling of face and throat  . A fast heartbeat  . A bad rash all over body  . Dizziness and weakness   Immunizations Administered    Name Date Dose VIS Date Route   Moderna COVID-19 Vaccine 03/06/2020  1:21 PM 0.5 mL 09/2019 Intramuscular   Manufacturer: Moderna   Lot: [id number]   NDC: 57972-820-60

## 2020-04-01 ENCOUNTER — Ambulatory Visit: Payer: Self-pay | Attending: Family

## 2020-04-01 DIAGNOSIS — Z23 Encounter for immunization: Secondary | ICD-10-CM

## 2020-04-01 NOTE — Progress Notes (Signed)
   Covid-19 Vaccination Clinic  Name:  Anthony Acosta    MRN: [id number] DOB: 1960-11-08  04/01/2020  Mr. [name] was observed post Covid-19 immunization for 15 minutes without incident. He was provided with Vaccine Information Sheet and instruction to access the V-Safe system.   Mr. [name] was instructed to call 911 with any severe reactions post vaccine: Marland Kitchen Difficulty breathing  . Swelling of face and throat  . A fast heartbeat  . A bad rash all over body  . Dizziness and weakness   Immunizations Administered    Name Date Dose VIS Date Route   Moderna COVID-19 Vaccine 04/01/2020  1:25 PM 0.5 mL 09/2019 Intramuscular   Manufacturer: Moderna   Lot: [id number]   NDC: 85501-586-82

## 2022-08-30 ENCOUNTER — Ambulatory Visit (INDEPENDENT_AMBULATORY_CARE_PROVIDER_SITE_OTHER): Payer: 59 | Admitting: Family Medicine

## 2022-08-30 ENCOUNTER — Encounter: Payer: Self-pay | Admitting: Family Medicine

## 2022-08-30 VITALS — BP 158/90 | HR 67 | Temp 99.2°F | Ht 69.5 in | Wt 197.4 lb

## 2022-08-30 DIAGNOSIS — E7800 Pure hypercholesterolemia, unspecified: Secondary | ICD-10-CM

## 2022-08-30 LAB — LIPID PANEL
Cholesterol: 164 mg/dL (ref 0–200)
HDL: 66.1 mg/dL (ref >39.00)
LDL Cholesterol: 89 mg/dL (ref 0–99)
NonHDL: 97.87
Total CHOL/HDL Ratio: 2
Triglycerides: 42 mg/dL (ref 0.0–149.0)
VLDL: 8.4 mg/dL (ref 0.0–40.0)

## 2022-08-30 LAB — COMPREHENSIVE METABOLIC PANEL
ALT: 27 U/L (ref 0–53)
AST: 31 U/L (ref 0–37)
Albumin: 4.5 g/dL (ref 3.5–5.2)
Alkaline Phosphatase: 50 U/L (ref 39–117)
BUN: 16 mg/dL (ref 6–23)
CO2: 27 mEq/L (ref 19–32)
Calcium: 9.2 mg/dL (ref 8.4–10.5)
Chloride: 104 mEq/L (ref 96–112)
Creatinine, Ser: 0.82 mg/dL (ref 0.40–1.50)
GFR: 95.54 mL/min (ref >60.00)
Glucose, Bld: 103 mg/dL — ABNORMAL HIGH (ref 70–99)
Potassium: 4.1 mEq/L (ref 3.5–5.1)
Sodium: 138 mEq/L (ref 135–145)
Total Bilirubin: 0.5 mg/dL (ref 0.2–1.2)
Total Protein: 7.7 g/dL (ref 6.0–8.3)

## 2022-08-30 LAB — CBC WITH DIFFERENTIAL/PLATELET
Basophils Absolute: 0.1 10*3/uL (ref 0.0–0.1)
Basophils Relative: 1.3 % (ref 0.0–3.0)
Eosinophils Absolute: 0.2 10*3/uL (ref 0.0–0.7)
Eosinophils Relative: 3.7 % (ref 0.0–5.0)
HCT: 46.4 % (ref 39.0–52.0)
Hemoglobin: 15.3 g/dL (ref 13.0–17.0)
Lymphocytes Relative: 37.9 % (ref 12.0–46.0)
Lymphs Abs: 1.9 10*3/uL (ref 0.7–4.0)
MCHC: 33 g/dL (ref 30.0–36.0)
MCV: 88.1 fl (ref 78.0–100.0)
Monocytes Absolute: 0.4 10*3/uL (ref 0.1–1.0)
Monocytes Relative: 8 % (ref 3.0–12.0)
Neutro Abs: 2.5 10*3/uL (ref 1.4–7.7)
Neutrophils Relative %: 49.1 % (ref 43.0–77.0)
Platelets: 248 10*3/uL (ref 150.0–400.0)
RBC: 5.27 Mil/uL (ref 4.22–5.81)
RDW: 13.3 % (ref 11.5–15.5)
WBC: 5.1 10*3/uL (ref 4.0–10.5)

## 2022-08-30 LAB — HEMOGLOBIN A1C: Hgb A1c MFr Bld: 5.8 % (ref 4.6–6.5)

## 2022-08-30 LAB — TSH: TSH: 0.96 u[IU]/mL (ref 0.35–5.50)

## 2022-08-30 MED ORDER — ATORVASTATIN CALCIUM 20 MG PO TABS: 20.0000 mg | ORAL_TABLET | Freq: Every day | ORAL | 3 refills | Status: DC

## 2022-08-30 NOTE — Progress Notes (Signed)
Continue meds. Cholesterol at goal A1C(3 month average of sugars) is elevated.  This is considered PreDiabetes.  Work on diet-decrease sugars and starches and aim for 30 minutes of exercise 5 days/week to prevent progression to diabetes

## 2022-08-30 NOTE — Progress Notes (Signed)
New Patient Office Visit  Subjective:  Patient ID: Anthony Acosta, male    DOB: [birth year]  Age: 61 y.o. MRN: [id number]  CC:  Chief Complaint  Patient presents with   Establish Care    Need new pcp Fasting     HPI Anthony Acosta presents for new pt. Changing from [place].  HLD.  From [place].  [place] 21 yrs.  HLD-per pt, not bad but on meds.  Labs >1 yr ago.   Past Medical History:  Diagnosis Date   Hyperlipidemia    on meds, unsure of name    Past Surgical History:  Procedure Laterality Date   NO PAST SURGERIES     Pt is unsure!    Family History  Problem Relation Age of Onset   Heart disease Mother 80   Diabetes Mother     Social History   Socioeconomic History   Marital status: Married    Spouse name: Not on file   Number of children: 2   Years of education: Not on file   Highest education level: Not on file  Occupational History   Not on file  Tobacco Use   Smoking status: Former    Packs/day: 1.00    Years: 28.00    Total pack years: 28.00    Types: Cigarettes    Quit date: 2010    Years since quitting: 13.8   Smokeless tobacco: Never  Vaping Use   Vaping Use: Never used  Substance and Sexual Activity   Alcohol use: Not Currently    Alcohol/week: 1.0 - 3.0 standard drink of alcohol    Types: 1 - 3 Shots of liquor per week   Drug use: No   Sexual activity: Yes    Birth control/protection: None  Other Topics Concern   Not on file  Social History Narrative   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   Social Determinants of Health   Financial Resource Strain: Not on file  Food Insecurity: Not on file  Transportation Needs: Not on file  Physical Activity: Not on file  Stress: Not on file  Social Connections: Not on file  Intimate Partner Violence: Not on file    ROS  ROS: Gen: no fever, chills  Skin: no rash, itching ENT: no ear pain, ear drainage, nasal congestion, rhinorrhea, sinus pressure, sore throat Eyes: no blurry vision,  double vision Resp: no cough, wheeze,SOB CV: no CP, palpitations, LE edema,  GI: no heartburn, n/v/d/c, abd pain GU: no dysuria, urgency, frequency, hematuria MSK: no joint pain, myalgias, back pain Neuro: no dizziness, headache, weakness, vertigo Psych: no depression, anxiety, insomnia, SI   pt states bp ok x at doctor.  130/80's.    Objective:   Today's Vitals: BP (!) 158/90   Pulse 67   Temp 99.2 F (37.3 C) (Temporal)   Ht 5' 9.5" (1.765 m)   Wt 197 lb 6 oz (89.5 kg)   SpO2 98%   BMI 28.73 kg/m   Physical Exam  Gen: WDWN NAD WM HEENT: NCAT, conjunctiva not injected, sclera nonicteric TM WNL B, OP moist, no exudates  NECK:  supple, no thyromegaly, no nodes, no carotid bruits CARDIAC: RRR, S1S2+, no murmur. DP 2+B LUNGS: CTAB. No wheezes ABDOMEN:  BS+, soft, NTND, No HSM, no masses EXT:  no edema MSK: no gross abnormalities.  NEURO: A&O x3.  CN II-XII intact.  PSYCH: normal mood. Good eye contact   Assessment & Plan:  Problem List Items Addressed This Visit   None Visit Diagnoses     Pure hypercholesterolemia    -  Primary   Relevant Medications   atorvastatin (LIPITOR) 20 MG tablet   Other Relevant Orders   Comprehensive metabolic panel   Hemoglobin A1c   TSH   Lipid panel   CBC with Differential/Platelet      HLD-chronic.  Controlled on atorvastatin(reviewed labs from 2021 in chart).  Cont atorvastatin 20mg  daily-renewed.  Check cbc,cmp,tsh,A1c,lipids.  F/u 1 yr Elevated bp-per pt, home <130/80-work on diet/exercise and monitor.  Suspect white coat.   Outpatient Encounter Medications as of 08/30/2022  Medication Sig   magnesium oxide (MAG-OX) 400 (240 Mg) MG tablet Take 400 mg by mouth daily.   [DISCONTINUED] atorvastatin (LIPITOR) 20 MG tablet Take 20 mg by mouth daily.   atorvastatin (LIPITOR) 20 MG tablet Take 1 tablet (20 mg total) by mouth daily.   Facility-Administered Encounter Medications as of 08/30/2022  Medication   0.9 %  sodium chloride  infusion    Follow-up: Return in about 1 year (around 08/31/2023) for annual.   Anthony Hampshire, MD

## 2022-08-30 NOTE — Patient Instructions (Signed)
Welcome to [facility] at [place]! It was a pleasure meeting you today.  As discussed, Please schedule a 12 month follow up visit today.  Happy Holidays!  PLEASE NOTE:  If you had any LAB tests please let us know if you have not heard back within a few days. You may see your results on MyChart before we have a chance to review them but we will give you a call once they are reviewed by us. If we ordered any REFERRALS today, please let us know if you have not heard from their office within the next week.  Let us know through MyChart if you are needing REFILLS, or have your pharmacy send us the request. You can also use MyChart to communicate with me or any office staff.  Please try these tips to maintain a healthy lifestyle:  Eat most of your calories during the day when you are active. Eliminate processed foods including packaged sweets (pies, cakes, cookies), reduce intake of potatoes, white bread, white pasta, and white rice. Look for whole grain options, oat flour or almond flour.  Each meal should contain half fruits/vegetables, one quarter protein, and one quarter carbs (no bigger than a computer mouse).  Cut down on sweet beverages. This includes juice, soda, and sweet tea. Also watch fruit intake, though this is a healthier sweet option, it still contains natural sugar! Limit to 3 servings daily.  Drink at least 1 glass of water with each meal and aim for at least 8 glasses per day  Exercise at least 150 minutes every week.   

## 2022-10-25 ENCOUNTER — Other Ambulatory Visit: Payer: Self-pay

## 2022-10-25 ENCOUNTER — Telehealth: Payer: Self-pay | Admitting: Family Medicine

## 2022-10-25 MED ORDER — ATORVASTATIN CALCIUM 20 MG PO TABS: 20.0000 mg | ORAL_TABLET | Freq: Every day | ORAL | 3 refills | Status: DC

## 2022-10-25 NOTE — Telephone Encounter (Signed)
Pt is needing to have all rx changed from [facility] to [facility] on [address].   Needing a refill on  atorvastatin (LIPITOR) 20 MG tablet

## 2022-10-25 NOTE — Telephone Encounter (Signed)
Refill sent to [facility] on [address]

## 2023-09-01 ENCOUNTER — Encounter: Payer: 59 | Admitting: Family Medicine

## 2023-09-12 ENCOUNTER — Ambulatory Visit (INDEPENDENT_AMBULATORY_CARE_PROVIDER_SITE_OTHER): Payer: 59 | Admitting: Family Medicine

## 2023-09-12 ENCOUNTER — Encounter: Payer: Self-pay | Admitting: Family Medicine

## 2023-09-12 VITALS — BP 160/80 | HR 70 | Temp 98.0°F | Resp 18 | Ht 69.5 in | Wt 205.4 lb

## 2023-09-12 DIAGNOSIS — E7800 Pure hypercholesterolemia, unspecified: Secondary | ICD-10-CM | POA: Diagnosis not present

## 2023-09-12 DIAGNOSIS — Z131 Encounter for screening for diabetes mellitus: Secondary | ICD-10-CM | POA: Diagnosis not present

## 2023-09-12 DIAGNOSIS — Z0000 Encounter for general adult medical examination without abnormal findings: Secondary | ICD-10-CM

## 2023-09-12 DIAGNOSIS — Z13220 Encounter for screening for lipoid disorders: Secondary | ICD-10-CM | POA: Diagnosis not present

## 2023-09-12 DIAGNOSIS — Z122 Encounter for screening for malignant neoplasm of respiratory organs: Secondary | ICD-10-CM | POA: Diagnosis not present

## 2023-09-12 DIAGNOSIS — Z125 Encounter for screening for malignant neoplasm of prostate: Secondary | ICD-10-CM | POA: Diagnosis not present

## 2023-09-12 DIAGNOSIS — Z1159 Encounter for screening for other viral diseases: Secondary | ICD-10-CM

## 2023-09-12 LAB — HEMOGLOBIN A1C: Hgb A1c MFr Bld: 5.8 % (ref 4.6–6.5)

## 2023-09-12 LAB — CBC WITH DIFFERENTIAL/PLATELET
Basophils Absolute: 0.1 10*3/uL (ref 0.0–0.1)
Basophils Relative: 1.3 % (ref 0.0–3.0)
Eosinophils Absolute: 0.2 10*3/uL (ref 0.0–0.7)
Eosinophils Relative: 4.2 % (ref 0.0–5.0)
HCT: 46.6 % (ref 39.0–52.0)
Hemoglobin: 15 g/dL (ref 13.0–17.0)
Lymphocytes Relative: 35.9 % (ref 12.0–46.0)
Lymphs Abs: 1.7 10*3/uL (ref 0.7–4.0)
MCHC: 32.2 g/dL (ref 30.0–36.0)
MCV: 89.4 fL (ref 78.0–100.0)
Monocytes Absolute: 0.3 10*3/uL (ref 0.1–1.0)
Monocytes Relative: 7 % (ref 3.0–12.0)
Neutro Abs: 2.4 10*3/uL (ref 1.4–7.7)
Neutrophils Relative %: 51.6 % (ref 43.0–77.0)
Platelets: 253 10*3/uL (ref 150.0–400.0)
RBC: 5.21 Mil/uL (ref 4.22–5.81)
RDW: 14 % (ref 11.5–15.5)
WBC: 4.7 10*3/uL (ref 4.0–10.5)

## 2023-09-12 LAB — COMPREHENSIVE METABOLIC PANEL
ALT: 23 U/L (ref 0–53)
AST: 26 U/L (ref 0–37)
Albumin: 4.5 g/dL (ref 3.5–5.2)
Alkaline Phosphatase: 53 U/L (ref 39–117)
BUN: 16 mg/dL (ref 6–23)
CO2: 24 meq/L (ref 19–32)
Calcium: 9.3 mg/dL (ref 8.4–10.5)
Chloride: 106 meq/L (ref 96–112)
Creatinine, Ser: 0.82 mg/dL (ref 0.40–1.50)
GFR: 94.85 mL/min (ref >60.00)
Glucose, Bld: 103 mg/dL — ABNORMAL HIGH (ref 70–99)
Potassium: 4.3 meq/L (ref 3.5–5.1)
Sodium: 139 meq/L (ref 135–145)
Total Bilirubin: 0.7 mg/dL (ref 0.2–1.2)
Total Protein: 7.5 g/dL (ref 6.0–8.3)

## 2023-09-12 LAB — LIPID PANEL
Cholesterol: 182 mg/dL (ref 0–200)
HDL: 58.4 mg/dL (ref >39.00)
LDL Cholesterol: 111 mg/dL — ABNORMAL HIGH (ref 0–99)
NonHDL: 123.54
Total CHOL/HDL Ratio: 3
Triglycerides: 61 mg/dL (ref 0.0–149.0)
VLDL: 12.2 mg/dL (ref 0.0–40.0)

## 2023-09-12 LAB — TSH: TSH: 0.9 u[IU]/mL (ref 0.35–5.50)

## 2023-09-12 LAB — PSA: PSA: 0.82 ng/mL (ref 0.10–4.00)

## 2023-09-12 MED ORDER — ATORVASTATIN CALCIUM 20 MG PO TABS: 20.0000 mg | ORAL_TABLET | Freq: Every day | ORAL | 3 refills | Status: AC

## 2023-09-12 NOTE — Assessment & Plan Note (Signed)
Chronic.  Controlled.  Continue lipitor 20mg 

## 2023-09-12 NOTE — Patient Instructions (Signed)

## 2023-09-12 NOTE — Progress Notes (Signed)
Phone: [phone]   Subjective:  Anthony Acosta 62 y.o. male presenting for annual physical.  Chief Complaint  Anthony Acosta presents with   Annual Exam    CPE Fasting   Accompanied by his wife.   Annual - Has not been regularly exercising.   Elevated BP - BPs running 135/70 at home. BP at initial check was 161/91. BP at recheck was 160/80.   Right Heel Pain - Complains of discomfort in his right heel for about 3-4 months. Did see ortho in the past and no X-ray. Reports his pain is worse is in the morning, to the touch,and when barefoot. More medial side rather than bottom.  Has changed shoes and gotten supports    See problem oriented charting- ROS- ROS: Gen: no fever, chills  Skin: no rash, itching ENT: no ear pain, ear drainage, nasal congestion, rhinorrhea, sinus pressure, sore throat Eyes: no blurry vision, double vision Resp: no cough, wheeze,SOB CV: no CP, palpitations, LE edema,  GI: no heartburn, n/v/d/c, abd pain GU: no dysuria, urgency, frequency, hematuria MSK: no joint pain, myalgias, back pain Neuro: no dizziness, headache, weakness, vertigo Psych: no depression, anxiety, insomnia, SI   The following were reviewed and entered/updated in epic: Past Medical History:  Diagnosis Date   Hyperlipidemia    on meds, unsure of name   Anthony Acosta Active Problem List   Diagnosis Date Noted   Pure hypercholesterolemia 09/12/2023   ELBOW PAIN 07/22/2010   INTERTRIGO, CANDIDAL 08/06/2009   TOBACCO USE, QUIT 08/06/2009   VITAMIN B12 DEFICIENCY 07/15/2009   CARPAL TUNNEL SYNDROME 07/15/2009   PARESTHESIA 10/23/2008   Past Surgical History:  Procedure Laterality Date   NO PAST SURGERIES     Pt is unsure!    Family History  Problem Relation Age of Onset   Heart disease Mother 52   Diabetes Mother     Medications- reviewed and updated Current Outpatient Medications  Medication Sig Dispense Refill   magnesium oxide (MAG-OX) 400 (240 Mg) MG tablet Take 400 mg by mouth  daily.     atorvastatin (LIPITOR) 20 MG tablet Take 1 tablet (20 mg total) by mouth daily. 90 tablet 3   No current facility-administered medications for this visit.    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 DP 2+B LUNGS: CTAB. No wheezes ABDOMEN:  BS+, soft, NTND, No HSM, no masses EXT:  no edema MSK: no gross abnormalities. MS 5/5 all 4 NEURO: A&O x3.  CN II-XII intact.  PSYCH: normal mood. Good eye contact  Some TTP R medial foot near ankle.  Declined lung cancer screening    Assessment and Plan   Health Maintenance counseling: 1. Anticipatory guidance: Anthony Acosta counseled regarding regular dental exams q6 months, eye exams yearly, avoiding smoking and second hand smoke, limiting alcohol to 2 beverages per day.   2. Risk factor  reduction:  Advised Anthony Acosta of need for regular exercise and diet rich in fruits and vegetables to reduce risk of heart attack and stroke. Exercise- Not regularly exercising.   Wt Readings from Last 3 Encounters:  09/12/23 205 lb 6 oz (93.2 kg)  08/30/22 197 lb 6 oz (89.5 kg)  11/01/16 171 lb (77.6 kg)   3. Immunizations/screenings/ancillary studies Immunization History  Administered Date(s) Administered   Influenza, Seasonal, Injecte, Preservative Fre 08/09/2015   Influenza,inj,Quad PF,6+ Mos 08/26/2018   Moderna Sars-Covid-2 Vaccination 03/06/2020, 04/01/2020   Tdap 04/23/2015   Zoster  Recombinant(Shingrix) 06/25/2019, 09/23/2019   Health Maintenance Due  Topic Date Due   HIV Screening  Never done   Hepatitis C Screening  Never done   Lung Cancer Screening  Never done    4. Prostate cancer screening >55yo - risk factors?  Lab Results  Component Value Date   PSA 0.49 08/23/2014   PSA 0.59 08/01/2013    5. Colon cancer screening:utd 6. Skin cancer screening- advised regular sunscreen use. Denies worrisome, changing, or new skin lesions.  7. Smoking associated screening (lung cancer screening, AAA screen 65-75, UA)- former smoker- 1 ppd for 28 yrs, declined screening 8. STD screening - n/a  Wellness examination -     CBC with Differential/Platelet -     Comprehensive metabolic panel -     Lipid panel -     PSA -     TSH -     Hemoglobin A1c -     Hepatitis C antibody -     HIV Antibody (routine testing w rflx)  Pure hypercholesterolemia Assessment & Plan: Chronic.  Controlled.  Continue lipitor 20mg   Orders: -     Atorvastatin Calcium; Take 1 tablet (20 mg total) by mouth daily.  Dispense: 90 tablet; Refill: 3  Screening for lung cancer  Screening for prostate cancer -     PSA  Screening for viral disease -     Hepatitis C antibody -     HIV Antibody (routine testing w rflx)   Wellness-anticipatory guidance.  Work on Diet/Exercise  Check CBC,CMP,lipids,TSH, A1C.  F/u 1 yr   R heel pain-discussed exercises, etc. He will f/u ortho next yr  Recommended follow up: Return in about 1 year (around 09/11/2024) for annual physical.  Lab/Order associations: fasting   I,Anaiya N Rice,acting as a scribe for Angelena Sole, MD.,have documented all relevant documentation on the behalf of Angelena Sole, MD,as directed by  Angelena Sole, MD while in the presence of Angelena Sole, MD.  I, Angelena Sole, MD, have reviewed all documentation for this visit. The documentation on 09/12/23 for the exam, diagnosis, procedures, and orders are all accurate and complete.   Angelena Sole, MD

## 2023-09-12 NOTE — Progress Notes (Signed)
Labs good.   Needs to work on diet/exercise-sugars stable but as always, prediabetic range

## 2023-09-13 ENCOUNTER — Other Ambulatory Visit: Payer: Self-pay | Admitting: *Deleted

## 2023-09-13 LAB — HEPATITIS C ANTIBODY: Hepatitis C Ab: NONREACTIVE

## 2023-09-13 LAB — HIV ANTIBODY (ROUTINE TESTING W REFLEX): HIV 1&2 Ab, 4th Generation: NONREACTIVE

## 2024-09-24 ENCOUNTER — Encounter: Payer: 59 | Admitting: Family Medicine

## 2024-10-23 ENCOUNTER — Encounter: Payer: Self-pay | Admitting: Family Medicine

## 2024-10-23 ENCOUNTER — Ambulatory Visit: Payer: Self-pay | Admitting: Family Medicine

## 2024-10-23 ENCOUNTER — Ambulatory Visit (INDEPENDENT_AMBULATORY_CARE_PROVIDER_SITE_OTHER): Admitting: Family Medicine

## 2024-10-23 VITALS — BP 128/80 | HR 76 | Temp 97.7°F | Ht 69.5 in | Wt 191.4 lb

## 2024-10-23 DIAGNOSIS — Z131 Encounter for screening for diabetes mellitus: Secondary | ICD-10-CM | POA: Diagnosis not present

## 2024-10-23 DIAGNOSIS — I10 Essential (primary) hypertension: Secondary | ICD-10-CM

## 2024-10-23 DIAGNOSIS — Z13220 Encounter for screening for lipoid disorders: Secondary | ICD-10-CM

## 2024-10-23 DIAGNOSIS — Z125 Encounter for screening for malignant neoplasm of prostate: Secondary | ICD-10-CM | POA: Diagnosis not present

## 2024-10-23 DIAGNOSIS — Z0001 Encounter for general adult medical examination with abnormal findings: Secondary | ICD-10-CM

## 2024-10-23 DIAGNOSIS — Z23 Encounter for immunization: Secondary | ICD-10-CM | POA: Diagnosis not present

## 2024-10-23 DIAGNOSIS — Z0000 Encounter for general adult medical examination without abnormal findings: Secondary | ICD-10-CM

## 2024-10-23 LAB — COMPREHENSIVE METABOLIC PANEL WITH GFR
ALT: 28 U/L (ref 3–53)
AST: 22 U/L (ref 5–37)
Albumin: 4.6 g/dL (ref 3.5–5.2)
Alkaline Phosphatase: 57 U/L (ref 39–117)
BUN: 23 mg/dL (ref 6–23)
CO2: 26 meq/L (ref 19–32)
Calcium: 9.4 mg/dL (ref 8.4–10.5)
Chloride: 101 meq/L (ref 96–112)
Creatinine, Ser: 1.04 mg/dL (ref 0.40–1.50)
GFR: 76.92 mL/min
Glucose, Bld: 84 mg/dL (ref 70–99)
Potassium: 4.1 meq/L (ref 3.5–5.1)
Sodium: 137 meq/L (ref 135–145)
Total Bilirubin: 0.7 mg/dL (ref 0.2–1.2)
Total Protein: 7.6 g/dL (ref 6.0–8.3)

## 2024-10-23 LAB — CBC WITH DIFFERENTIAL/PLATELET
Basophils Absolute: 0.1 K/uL (ref 0.0–0.1)
Basophils Relative: 0.8 % (ref 0.0–3.0)
Eosinophils Absolute: 0.1 K/uL (ref 0.0–0.7)
Eosinophils Relative: 1.4 % (ref 0.0–5.0)
HCT: 45.8 % (ref 39.0–52.0)
Hemoglobin: 15.2 g/dL (ref 13.0–17.0)
Lymphocytes Relative: 26.3 % (ref 12.0–46.0)
Lymphs Abs: 2.2 K/uL (ref 0.7–4.0)
MCHC: 33.1 g/dL (ref 30.0–36.0)
MCV: 86.9 fl (ref 78.0–100.0)
Monocytes Absolute: 0.5 K/uL (ref 0.1–1.0)
Monocytes Relative: 5.6 % (ref 3.0–12.0)
Neutro Abs: 5.6 K/uL (ref 1.4–7.7)
Neutrophils Relative %: 65.9 % (ref 43.0–77.0)
Platelets: 307 K/uL (ref 150.0–400.0)
RBC: 5.27 Mil/uL (ref 4.22–5.81)
RDW: 13.9 % (ref 11.5–15.5)
WBC: 8.5 K/uL (ref 4.0–10.5)

## 2024-10-23 LAB — TSH: TSH: 1.01 u[IU]/mL (ref 0.35–5.50)

## 2024-10-23 LAB — LIPID PANEL
Cholesterol: 167 mg/dL (ref 28–200)
HDL: 54.1 mg/dL
LDL Cholesterol: 96 mg/dL (ref 10–99)
NonHDL: 113.04
Total CHOL/HDL Ratio: 3
Triglycerides: 84 mg/dL (ref 10.0–149.0)
VLDL: 16.8 mg/dL (ref 0.0–40.0)

## 2024-10-23 LAB — PSA: PSA: 1.24 ng/mL (ref 0.10–4.00)

## 2024-10-23 LAB — HEMOGLOBIN A1C: Hgb A1c MFr Bld: 5.7 % (ref 4.6–6.5)

## 2024-10-23 MED ORDER — AMLODIPINE BESYLATE 5 MG PO TABS: 5.0000 mg | ORAL_TABLET | Freq: Every day | ORAL | 2 refills | Status: AC

## 2024-10-23 MED ORDER — VALSARTAN 80 MG PO TABS: 80.0000 mg | ORAL_TABLET | Freq: Every day | ORAL | 1 refills | Status: DC

## 2024-10-23 NOTE — Progress Notes (Signed)
 Labs are great

## 2024-10-23 NOTE — Patient Instructions (Addendum)
 It was very nice to see you today! Amlodipine  and monitor bp    PLEASE NOTE:  If you had any lab tests please let us  know if you have not heard back within a few days. You may see your results on MyChart before we have a chance to review them but we will give you a call once they are reviewed by us . If we ordered any referrals today, please let us  know if you have not heard from their office within the next week.   Please try these tips to maintain a healthy lifestyle:  Eat most of your calories during the day when you are active. Eliminate processed foods including packaged sweets (pies, cakes, cookies), reduce intake of potatoes, white bread, white pasta, and white rice. Look for whole grain options, oat flour or almond flour.  Each meal should contain half fruits/vegetables, one quarter protein, and one quarter carbs (no bigger than a computer mouse).  Cut down on sweet beverages. This includes juice, soda, and sweet tea. Also watch fruit intake, though this is a healthier sweet option, it still contains natural sugar! Limit to 3 servings daily.  Drink at least 1 glass of water with each meal and aim for at least 8 glasses per day  Exercise at least 150 minutes every week.

## 2024-10-23 NOTE — Progress Notes (Signed)
 " Phone: [phone]   Subjective:  Patient 64 y.o. male presenting for annual physical.  Chief Complaint  Patient presents with   Annual Exam    Pt is here for CPE    Discussed the use of AI scribe software for clinical note transcription with the patient, who gave verbal consent to proceed.  History of Present Illness Anthony Acosta is a 64 year old male who presents for an annual physical exam.  He is experiencing significant emotional stress following the recent passing of his wife due to cancer and the loss of his job due to company bankruptcy. He is set to start a new job in February and feels 'not too bad' overall.  He has noticed fluctuations in his blood pressure, with readings sometimes reaching 150/70 mmHg. He checks his blood pressure daily and notes variability throughout the day. He wants medication to help maintain a more consistent blood pressure level, especially as he needs to maintain his driver's license for work purposes. No episodes of low blood pressure, headaches, dizziness, chest pain, heart racing, or skipping beats.  He reports a weight loss of 14 pounds since last year. He acknowledges the need to lose more weight and has recently resumed exercising after a period of inactivity.  He describes a bulge in his right bicep that has been present for about five to six months. It is not painful, but he is unsure of its cause. 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  He is fasting today in preparation for blood work, having only consumed tea without sugar.    See problem oriented charting- ROS- ROS: Gen: no fever, chills  Skin: no rash, itching ENT: no ear pain, ear drainage, nasal congestion, rhinorrhea, sinus pressure, sore throat Eyes: no blurry vision, double vision Resp: no cough, wheeze,SOB CV: no CP, palpitations, LE edema,  GI: no heartburn, n/v/d/c, abd pain GU: no dysuria,  urgency, frequency, hematuria MSK: no joint pain, myalgias, back pain Neuro: no dizziness, headache, weakness, vertigo Psych: no depression, anxiety, insomnia, SI   The following were reviewed and entered/updated in epic: Past Medical History:  Diagnosis Date   Hyperlipidemia    on meds, unsure of name   Patient Active Problem List   Diagnosis Date Noted   Pure hypercholesterolemia 09/12/2023   ELBOW PAIN 07/22/2010   INTERTRIGO, CANDIDAL 08/06/2009   TOBACCO USE, QUIT 08/06/2009   VITAMIN B12 DEFICIENCY 07/15/2009   CARPAL TUNNEL SYNDROME 07/15/2009   PARESTHESIA 10/23/2008   Past Surgical History:  Procedure Laterality Date   NO PAST SURGERIES     Pt is unsure!    Family History  Problem Relation Age of Onset   Heart disease Mother 10   Diabetes Mother     Medications- reviewed and updated Current Outpatient Medications  Medication Sig Dispense Refill   amLODipine  (NORVASC ) 5 MG tablet Take 1 tablet (5 mg total) by mouth daily. 30 tablet 2   atorvastatin  (LIPITOR) 20 MG tablet Take 1 tablet (20 mg total) by mouth daily. 90 tablet 3   magnesium oxide (MAG-OX) 400 (240 Mg) MG tablet Take 400 mg by mouth daily.     No current facility-administered medications for this visit.    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 DP 2+B LUNGS: CTAB. No wheezes ABDOMEN:  BS+, soft, NTND, No HSM, no masses EXT:  no edema MSK: no gross abnormalities. MS  5/5 all 4.  Biceps head in R arm NEURO: A&O x3.  CN II-XII intact.  PSYCH: normal mood. Good eye contact     Assessment and Plan   Health Maintenance counseling: 1. Anticipatory guidance: Patient counseled regarding regular dental exams q6 months, eye exams yearly, avoiding smoking and second hand smoke, limiting alcohol to 2 beverages per day.   2. Risk factor reduction:  Advised patient of need for regular exercise and diet rich in fruits and vegetables to reduce risk of heart attack and stroke. Exercise- get back.   Wt Readings from Last 3 Encounters:  10/23/24 191 lb 6 oz (86.8 kg)  09/12/23 205 lb 6 oz (93.2 kg)  08/30/22 197 lb 6 oz (89.5 kg)   3. Immunizations/screenings/ancillary studies Immunization History  Administered Date(s) Administered   Influenza, Seasonal, Injecte, Preservative Fre 08/09/2015   Influenza,inj,Quad PF,6+ Mos 08/26/2018   Moderna Sars-Covid-2 Vaccination 03/06/2020, 04/01/2020   Tdap 04/23/2015, 10/23/2024   Zoster Recombinant(Shingrix) 06/25/2019, 09/23/2019   Zoster, Live 06/25/2019, 09/23/2019   There are no preventive care reminders to display for this patient.  4. Prostate cancer screening >55yo - risk factors?  Lab Results  Component Value Date   PSA 1.24 10/23/2024   PSA 0.82 09/12/2023   PSA 0.49 08/23/2014    5. Colon cancer screening:2028 6. Skin cancer screening- Iadvised regular sunscreen use. Denies worrisome, changing, or new skin lesions.  7. Smoking associated screening (lung cancer screening, AAA screen 65-75, UA)- former smoker  Wellness examination -     Lipid panel -     PSA -     TSH -     Comprehensive metabolic panel with GFR -     CBC with Differential/Platelet -     Hemoglobin A1c  Screening for prostate cancer -     PSA  Immunization due -     Tdap vaccine greater than or equal to 7yo IM  Other orders -     amLODIPine  Besylate; Take 1 tablet (5 mg total) by mouth daily.  Dispense: 30 tablet; Refill:  2    Assessment and Plan Assessment & Plan Hypertension   He experiences intermittent elevated blood pressure, especially during physical activity, with recent readings reaching 150/70 mmHg. He reports no chest pain, palpitations, or dizziness. Prescribed amlodipine  5mg  daily. A follow-up appointment is scheduled in one month to reassess blood pressure -no ins so he will monitor and f/u 3 mo. He is instructed to monitor blood pressure daily and report any significant changes or symptoms such as headaches, shortness of breath, or dizziness.  Rupture of right biceps tendon   A bulge in the right biceps area has been present for approximately 5-6 months, likely due to a ruptured biceps tendon. He reports no pain or functional impairment. A lipoma is considered in the differential diagnosis but is less likely due to the size and duration. He  should continue to monitor for changes in size or pain. If symptoms worsen or the bulge increases in size, a referral to sports medicine for further evaluation and possible ultrasound will be considered.  General Health Maintenance   Routine health maintenance was discussed, including vaccinations and screenings. 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 The pneumonia vaccination was discussed but deferred. Colonoscopy screening is up to date until 2028.     Recommended follow up: Return in about 3 months (around 01/21/2025) for HTN.  Lab/Order associations:+ fasting   Jenkins CHRISTELLA Carrel, MD     [1] No Known Allergies  "

## 2024-10-24 NOTE — Progress Notes (Signed)
 Called pt and notified.

## 2024-11-16 ENCOUNTER — Encounter: Admitting: Family Medicine

## 2024-12-17 ENCOUNTER — Encounter: Admitting: Family Medicine

## 2025-02-15 ENCOUNTER — Ambulatory Visit: Admitting: Family Medicine
